# Patient Record
Sex: Female | Born: 1990 | Race: Black or African American | Hispanic: No | Marital: Single | Smoking: Current some day smoker
Health system: Southern US, Community
[De-identification: ages and names within clinical notes are randomized; demographics above are authoritative.]

## PROBLEM LIST (undated history)

## (undated) ENCOUNTER — Emergency Department (HOSPITAL_COMMUNITY): Admission: EM | Payer: BC Managed Care – PPO | Source: Home / Self Care

## (undated) DIAGNOSIS — J189 Pneumonia, unspecified organism: Secondary | ICD-10-CM

## (undated) DIAGNOSIS — G43909 Migraine, unspecified, not intractable, without status migrainosus: Secondary | ICD-10-CM

## (undated) DIAGNOSIS — F909 Attention-deficit hyperactivity disorder, unspecified type: Secondary | ICD-10-CM

## (undated) DIAGNOSIS — IMO0002 Reserved for concepts with insufficient information to code with codable children: Secondary | ICD-10-CM

## (undated) DIAGNOSIS — N946 Dysmenorrhea, unspecified: Secondary | ICD-10-CM

## (undated) HISTORY — DX: Reserved for concepts with insufficient information to code with codable children: IMO0002

## (undated) HISTORY — DX: Migraine, unspecified, not intractable, without status migrainosus: G43.909

## (undated) HISTORY — DX: Dysmenorrhea, unspecified: N94.6

---
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
## 2011-06-03 ENCOUNTER — Emergency Department (HOSPITAL_COMMUNITY)
Admission: EM | Admit: 2011-06-03 | Discharge: 2011-06-03 | Disposition: A | Discharge status: Home / Self Care | Payer: Self-pay | Attending: Emergency Medicine | Admitting: Emergency Medicine

## 2011-06-03 DIAGNOSIS — R1110 Vomiting, unspecified: Secondary | ICD-10-CM | POA: Insufficient documentation

## 2011-06-03 DIAGNOSIS — K5289 Other specified noninfective gastroenteritis and colitis: Secondary | ICD-10-CM | POA: Insufficient documentation

## 2011-06-03 DIAGNOSIS — R197 Diarrhea, unspecified: Secondary | ICD-10-CM | POA: Insufficient documentation

## 2011-06-03 DIAGNOSIS — O9989 Other specified diseases and conditions complicating pregnancy, childbirth and the puerperium: Secondary | ICD-10-CM | POA: Insufficient documentation

## 2011-06-03 DIAGNOSIS — R109 Unspecified abdominal pain: Secondary | ICD-10-CM | POA: Insufficient documentation

## 2011-06-03 LAB — CBC
HCT: 41 % (ref 36.0–46.0)
MCH: 30.3 pg (ref 26.0–34.0)
MCHC: 36.1 g/dL — ABNORMAL HIGH (ref 30.0–36.0)
MCV: 84 fL (ref 78.0–100.0)
Platelets: 257 10*3/uL (ref 150–400)
RDW: 12.2 % (ref 11.5–15.5)

## 2011-06-03 LAB — DIFFERENTIAL
Eosinophils Absolute: 0.1 10*3/uL (ref 0.0–0.7)
Eosinophils Relative: 1 % (ref 0–5)
Lymphocytes Relative: 26 % (ref 12–46)
Lymphs Abs: 3.2 10*3/uL (ref 0.7–4.0)
Monocytes Absolute: 1.5 10*3/uL — ABNORMAL HIGH (ref 0.1–1.0)

## 2011-06-03 LAB — URINALYSIS, ROUTINE W REFLEX MICROSCOPIC
Hgb urine dipstick: NEGATIVE
Ketones, ur: >80 mg/dL — AB
Specific Gravity, Urine: 1.029 (ref 1.005–1.030)
Urobilinogen, UA: 2 mg/dL — ABNORMAL HIGH (ref 0.0–1.0)

## 2011-06-03 LAB — BASIC METABOLIC PANEL
BUN: 7 mg/dL (ref 6–23)
Calcium: 9 mg/dL (ref 8.4–10.5)
Creatinine, Ser: 0.48 mg/dL — ABNORMAL LOW (ref 0.50–1.10)
GFR calc Af Amer: >60 mL/min (ref >60)
GFR calc non Af Amer: >60 mL/min (ref >60)

## 2011-11-13 DIAGNOSIS — R87619 Unspecified abnormal cytological findings in specimens from cervix uteri: Secondary | ICD-10-CM

## 2011-11-13 DIAGNOSIS — IMO0002 Reserved for concepts with insufficient information to code with codable children: Secondary | ICD-10-CM

## 2011-11-13 HISTORY — DX: Reserved for concepts with insufficient information to code with codable children: IMO0002

## 2011-11-13 HISTORY — DX: Unspecified abnormal cytological findings in specimens from cervix uteri: R87.619

## 2011-12-24 ENCOUNTER — Encounter (HOSPITAL_COMMUNITY): Payer: Self-pay | Admitting: *Deleted

## 2011-12-24 ENCOUNTER — Emergency Department (INDEPENDENT_AMBULATORY_CARE_PROVIDER_SITE_OTHER)
Admission: EM | Admit: 2011-12-24 | Discharge: 2011-12-24 | Disposition: A | Discharge status: Home / Self Care | Payer: PRIVATE HEALTH INSURANCE | Source: Home / Self Care | Attending: Emergency Medicine | Admitting: Emergency Medicine

## 2011-12-24 DIAGNOSIS — J40 Bronchitis, not specified as acute or chronic: Secondary | ICD-10-CM

## 2011-12-24 HISTORY — DX: Pneumonia, unspecified organism: J18.9

## 2011-12-24 MED ORDER — DOXYCYCLINE HYCLATE 100 MG PO TABS: 100.0000 mg | ORAL_TABLET | Freq: Two times a day (BID) | ORAL | Status: AC

## 2011-12-24 MED ORDER — ALBUTEROL SULFATE HFA 108 (90 BASE) MCG/ACT IN AERS: 1.0000 | INHALATION_SPRAY | Freq: Four times a day (QID) | RESPIRATORY_TRACT | Status: DC | PRN

## 2011-12-24 MED ORDER — GUAIFENESIN-CODEINE 100-10 MG/5ML PO SYRP: 10.0000 mL | ORAL_SOLUTION | Freq: Four times a day (QID) | ORAL | Status: AC | PRN

## 2011-12-24 NOTE — Discharge Instructions (Signed)

## 2011-12-24 NOTE — ED Provider Notes (Signed)
Chief Complaint  Patient presents with  . Pleurisy    History of Present Illness:   The patient presents with a two-week history of cough productive of yellow-green sputum, tightness in chest, weakness, fatigue, chills, chest pain, states it hurts to breathe, has had sore throat, nasal congestion, rhinorrhea, and sneezing.  Review of Systems:  Other than noted above, the patient denies any of the following symptoms. Systemic:  No fever, chills, sweats, fatigue, myalgias, headache, or anorexia. Eye:  No redness, pain or drainage. ENT:  No earache, nasal congestion, rhinorrhea, sinus pressure, or sore throat. Lungs:  No cough, sputum production, wheezing, shortness of breath. Or chest pain. GI:  No nausea, vomiting, abdominal pain or diarrhea. Skin:  No rash or itching.  PMFSH:  Past medical history, family history, social history, meds, and allergies were reviewed.  Physical Exam:   Vital signs:  BP 132/86  Pulse 78  Temp(Src) 99.6 F (37.6 C) (Oral)  Resp 18  SpO2 100%  LMP 12/13/2011 General:  Alert, in no distress. Eye:  No conjunctival injection or drainage. ENT:  TMs and canals were normal, without erythema or inflammation.  Nasal mucosa was clear and uncongested, without drainage.  Mucous membranes were moist.  Pharynx was clear, without exudate or drainage.  There were no oral ulcerations or lesions. Neck:  Supple, no adenopathy, tenderness or mass. Lungs:  No respiratory distress.  Lungs were clear to auscultation, without wheezes, rales or rhonchi.  Breath sounds were clear and equal bilaterally. Heart:  Regular rhythm, without gallops, murmers or rubs. Skin:  Clear, warm, and dry, without rash or lesions.  Assessment:   Diagnoses that have been ruled out:  None  Diagnoses that are still under consideration:  None  Final diagnoses:  Bronchitis      Plan:   1.  The following meds were prescribed:   New Prescriptions   ALBUTEROL (PROVENTIL HFA;VENTOLIN HFA) 108 (90  BASE) MCG/ACT INHALER    Inhale 1-2 puffs into the lungs every 6 (six) hours as needed for wheezing.   DOXYCYCLINE (VIBRA-TABS) 100 MG TABLET    Take 1 tablet (100 mg total) by mouth 2 (two) times daily.   GUAIFENESIN-CODEINE (GUIATUSS AC) 100-10 MG/5ML SYRUP    Take 10 mLs by mouth 4 (four) times daily as needed for cough.   2.  The patient was instructed in symptomatic care and handouts were given. 3.  The patient was told to return if becoming worse in any way, if no better in 3 or 4 days, and given some red flag symptoms that would indicate earlier return.   Roque Lias, MD 12/24/11 [phone]

## 2011-12-24 NOTE — ED Notes (Signed)
C/o chest burning. Chest congestion. Coughing causing pain

## 2012-07-29 ENCOUNTER — Emergency Department (HOSPITAL_COMMUNITY)
Admission: EM | Admit: 2012-07-29 | Discharge: 2012-07-29 | Disposition: A | Discharge status: Home / Self Care | Payer: Self-pay | Attending: Emergency Medicine | Admitting: Emergency Medicine

## 2012-07-29 ENCOUNTER — Emergency Department (HOSPITAL_COMMUNITY): Payer: Self-pay

## 2012-07-29 ENCOUNTER — Encounter (HOSPITAL_COMMUNITY): Payer: Self-pay | Admitting: Emergency Medicine

## 2012-07-29 DIAGNOSIS — J45909 Unspecified asthma, uncomplicated: Secondary | ICD-10-CM | POA: Insufficient documentation

## 2012-07-29 DIAGNOSIS — J9801 Acute bronchospasm: Secondary | ICD-10-CM

## 2012-07-29 DIAGNOSIS — J069 Acute upper respiratory infection, unspecified: Secondary | ICD-10-CM | POA: Insufficient documentation

## 2012-07-29 DIAGNOSIS — F17200 Nicotine dependence, unspecified, uncomplicated: Secondary | ICD-10-CM | POA: Insufficient documentation

## 2012-07-29 MED ORDER — PREDNISONE 20 MG PO TABS: ORAL_TABLET | ORAL | Status: DC

## 2012-07-29 MED ORDER — ALBUTEROL SULFATE HFA 108 (90 BASE) MCG/ACT IN AERS: 2.0000 | INHALATION_SPRAY | RESPIRATORY_TRACT | Status: DC | PRN

## 2012-07-29 NOTE — ED Provider Notes (Signed)
History   This chart was scribed for Bethany Horn, MD by Melba Coon. The patient was seen in room TR08C/TR08C and the patient's care was started at 5:01PM.    CSN: [id number]  Arrival date & time 07/29/12  1503   None     Chief Complaint  Patient presents with  . Cough    (Consider location/radiation/quality/duration/timing/severity/associated sxs/prior treatment) HPI Bethany Hampton is a 21 y.o. female who presents to the Emergency Department complaining of persistent, moderate to productive severe cough with an onset 3 days ago. Cough produces yellow and green sputum. Burning CP, cough-induced vomit x3, and SOB present since this morning. Wheezing and dizziness present. Denies confusion or hallucinations. Denies HA, fever, neck pain, sore throat, rash, back pain, abd pain, diarrhea, dysuria, or extremity pain, edema, weakness, numbness, or tingling. Wheezing and had to use an inhaler when she had previous Hx of pneumonia. No known allergies. No other pertinent medical symptoms.   Past Medical History  Diagnosis Date  . Asthma   . Pneumonia     Past Surgical History  Procedure Date  . Breast surgery     Family History  Problem Relation Age of Onset  . Heart failure Mother   . Cancer Mother   . Hypertension Sister     History  Substance Use Topics  . Smoking status: Current Some Day Smoker  . Smokeless tobacco: Not on file  . Alcohol Use: Yes     occasional    OB History    Grav Para Term Preterm Abortions TAB SAB Ect Mult Living   1 0   1           Review of Systems 10 Systems reviewed and all are negative for acute change except as noted in the HPI.   Allergies  Review of patient's allergies indicates no known allergies.  Home Medications   Current Outpatient Rx  Name Route Sig Dispense Refill  . ALBUTEROL SULFATE HFA 108 (90 BASE) MCG/ACT IN AERS Inhalation Inhale 1-2 puffs into the lungs every 6 (six) hours as needed for wheezing. 1 Inhaler 0    . ALBUTEROL SULFATE HFA 108 (90 BASE) MCG/ACT IN AERS Inhalation Inhale 2 puffs into the lungs every 2 (two) hours as needed for wheezing or shortness of breath (cough). 1 Inhaler 0  . PREDNISONE 20 MG PO TABS  3 tabs po day one, then 2 po daily x 4 days 11 tablet 0    BP 135/94  Pulse 95  Temp 97.5 F (36.4 C) (Oral)  Resp 18  SpO2 97%  LMP 07/20/2012  Physical Exam  Nursing note and vitals reviewed. Constitutional: She is oriented to person, place, and time. She appears well-developed and well-nourished. No distress.  HENT:  Head: Normocephalic and atraumatic.       oropharynx is mildly erythematous but no tonsilar swelling or exudate; uvula midline.  Eyes: EOM are normal.  Neck: Neck supple. No tracheal deviation present.  Cardiovascular: Normal rate, regular rhythm and normal heart sounds.   Pulmonary/Chest: Effort normal. No respiratory distress. She has wheezes (diffuse scattered bilateral wheezes and rhonchi). She has no rales. She exhibits tenderness (mild).       Can speak full sentences.  Abdominal: Soft. There is no tenderness.  Musculoskeletal: Normal range of motion.       generalized body aches especially around the bilateral upper extremities.  Neurological: She is alert and oriented to person, place, and time.  Skin: Skin is warm and dry.  Psychiatric: She has a normal mood and affect. Her behavior is normal.    ED Course  Procedures (including critical care time)   COORDINATION OF CARE:  5:04PM - imaging reviewed; imaging is unremarkable. Mrs , pinkus will be Rx with albuterol inhaler and prednisone and is advised to use Tylenol or Motrin at home. Pt is ready for d/c.   Labs Reviewed - No data to display Dg Chest 2 View  07/29/2012  *RADIOLOGY REPORT*  Clinical Data: Productive cough.  Chest heaviness.  CHEST - 2 VIEW  Comparison: None.  Findings: Trachea is midline.  Heart size normal.  Lungs are clear. No pleural fluid.  IMPRESSION: No acute findings.    Original Report Authenticated By: Reyes Ivan, M.D.      1. Bronchospasm   2. URI, acute       MDM  I personally performed the services described in this documentation, which was scribed in my presence. The recorded information has been reviewed and considered. Pt stable in ED with no significant deterioration in condition.Patient / Family / Caregiver informed of clinical course, understand medical decision-making process, and agree with plan.I doubt any other EMC precluding discharge at this time including, but not necessarily limited to the following:sepsis.        Bethany Horn, MD 07/30/12 [phone]

## 2012-07-29 NOTE — ED Notes (Signed)
EKG completed at 1512 and given to Dr. Rubin Payor. No OLD ekg.

## 2012-07-29 NOTE — ED Notes (Signed)
C/o productive cough with yellow and green sputum since Tuesday.  Reports burning sensation in chest, vomited x 3, and sob since this morning.

## 2013-03-21 ENCOUNTER — Encounter: Payer: Self-pay | Admitting: Obstetrics and Gynecology

## 2013-03-21 ENCOUNTER — Ambulatory Visit (INDEPENDENT_AMBULATORY_CARE_PROVIDER_SITE_OTHER): Payer: BC Managed Care – PPO | Admitting: Obstetrics and Gynecology

## 2013-03-21 VITALS — BP 122/80 | Ht 67.0 in | Wt 224.0 lb

## 2013-03-21 DIAGNOSIS — Z0000 Encounter for general adult medical examination without abnormal findings: Secondary | ICD-10-CM

## 2013-03-21 DIAGNOSIS — Z01419 Encounter for gynecological examination (general) (routine) without abnormal findings: Secondary | ICD-10-CM

## 2013-03-21 LAB — POCT URINALYSIS DIPSTICK
Bilirubin, UA: NEGATIVE
Ketones, UA: NEGATIVE
Leukocytes, UA: NEGATIVE
pH, UA: 5

## 2013-03-21 LAB — HEMOGLOBIN, FINGERSTICK: Hemoglobin, fingerstick: 15.8 g/dL (ref 12.0–16.0)

## 2013-03-21 NOTE — Patient Instructions (Signed)

## 2013-03-21 NOTE — Progress Notes (Signed)
22 y.o.   Single    African American   female   [id number]   here for annual exam.  Wants to restart birth control.  Nuva ring caused abd pain daily.  She tried pills but she always forgot them.  Scared she will gain weight with Depo-provera.  Sister has IUD and her cousin does too, so pt thinks she would like to try that.  Pt has severe menstrual cramps and heavy bleeding for first and fourth day of menses. Wants STD testing. 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          Sexually active: yes  The current method of family planning is condoms sometimes.    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   Last cholesterol check: never  Hgb:    15.8            Urine:neg   Family History  Problem Relation Age of Onset  . Heart failure Mother   . Hypertension Mother   . Hypertension Sister   . Cancer Paternal Grandmother     There are no active problems to display for this patient.   Past Medical History  Diagnosis Date  . Asthma   . Pneumonia   . Dysmenorrhea   . Abnormal pap 11/13/11    LSIL  HPV/MILD Dysplasia/CIN 1  . Migraines     Past Surgical History  Procedure Laterality Date  . Breast surgery  2005    Rt breast cyst(benign)    Allergies: Review of patient's allergies indicates no known allergies.  Current Outpatient Prescriptions  Medication Sig Dispense Refill  . albuterol (PROVENTIL HFA;VENTOLIN HFA) 108 (90 BASE) MCG/ACT inhaler Inhale 2 puffs into the lungs every 2 (two) hours as needed for wheezing or shortness of breath (cough).  1 Inhaler  0  . DiphenhydrAMINE HCl (BENADRYL ALLERGY PO) Take by mouth as needed.      . Ibuprofen (MIDOL PO) Take by mouth as needed.       No  current facility-administered medications for this visit.  Asthma is mild and she uses inhaler rarely.    ROS: Pertinent items are noted in HPI.  Social Hx:  Single, no children, works at a [employer] center,  In school at [employer] and will graduate in December with a degree in Sociology and minors in psych and special ed.  Wants to go to [employer] next fall for masters program in Social Work. No signif other.    Exam:    BP 122/80  Ht 5\' 7"  (1.702 m)  Wt 224 lb (101.606 kg)  BMI 35.08 kg/m2  LMP 03/02/2013   Wt Readings from Last 3 Encounters:  03/21/13 224 lb (101.606 kg)     Ht Readings from Last 3 Encounters:  03/21/13 5\' 7"  (1.702 m)    General appearance: alert, cooperative and appears stated age Head: Normocephalic, without obvious abnormality, atraumatic Neck: no adenopathy, supple, symmetrical, trachea midline and thyroid not enlarged, symmetric, no tenderness/mass/nodules Lungs: clear to auscultation bilaterally Breasts: Inspection negative, No nipple retraction or dimpling, No nipple discharge or bleeding, No axillary or supraclavicular adenopathy, Normal to palpation without dominant masses Heart: regular rate and rhythm Abdomen: soft, non-tender; bowel sounds  normal; no masses,  no organomegaly Extremities: extremities normal, atraumatic, no cyanosis or edema Skin: Skin color, texture, turgor normal. No rashes or lesions Lymph nodes: Cervical, supraclavicular, and axillary nodes normal. No abnormal inguinal nodes palpated Neurologic: Grossly normal   Pelvic: External genitalia:  no lesions              Urethra:  normal appearing urethra with no masses, tenderness or lesions              Bartholins and Skenes: normal                 Vagina: normal appearing vagina with normal color and discharge, no lesions              Cervix: normal appearance              Pap taken: yes        Bimanual Exam:  Uterus:  uterus is normal size, shape, consistency and nontender, mid,  mobile                                      Adnexa: normal adnexa in size, nontender and no masses                                      Rectovaginal: Confirms                                      Anus:  normal sphincter tone, no lesions  A: normal gyn exam, wants IUD and STD check     P: pap smear.  Discussed CIN with pt and questions answered.  May need colpo is pap abnl again.  counseled on breast self exam, STD prevention, family planning choices, diet and exercise return annually or prn   Brochure on Mirena given,  Pt very anxious about exam and may need pre procedure sedative   An After Visit Summary was printed and given to the patient.

## 2013-03-22 LAB — STD PANEL: Hepatitis B Surface Ag: NEGATIVE

## 2013-03-23 LAB — GC/CHLAMYDIA PROBE AMP, URINE: GC Probe Amp, Urine: NEGATIVE

## 2013-03-24 ENCOUNTER — Telehealth: Payer: Self-pay | Admitting: *Deleted

## 2013-03-24 LAB — IPS PAP TEST WITH HPV

## 2013-03-24 NOTE — Telephone Encounter (Signed)
Message copied by Ernest Haber on Fri Mar 24, 2013  5:00 PM ------      Message from: Meredeth Ide P      Created: Thu Mar 23, 2013  3:39 PM       Let pt know STD screen negative. ------

## 2013-03-28 ENCOUNTER — Telehealth: Payer: Self-pay | Admitting: *Deleted

## 2013-03-28 NOTE — Telephone Encounter (Signed)
PATIENT NOTIFIED OF LAB RESULTS OF STD TESTING.

## 2013-04-06 ENCOUNTER — Encounter (HOSPITAL_COMMUNITY): Payer: Self-pay | Admitting: Emergency Medicine

## 2013-04-06 ENCOUNTER — Emergency Department (HOSPITAL_COMMUNITY)
Admission: EM | Admit: 2013-04-06 | Discharge: 2013-04-06 | Disposition: A | Discharge status: Home / Self Care | Payer: BC Managed Care – PPO | Attending: Emergency Medicine | Admitting: Emergency Medicine

## 2013-04-06 DIAGNOSIS — Y929 Unspecified place or not applicable: Secondary | ICD-10-CM | POA: Insufficient documentation

## 2013-04-06 DIAGNOSIS — IMO0002 Reserved for concepts with insufficient information to code with codable children: Secondary | ICD-10-CM | POA: Insufficient documentation

## 2013-04-06 DIAGNOSIS — M545 Low back pain: Secondary | ICD-10-CM

## 2013-04-06 DIAGNOSIS — X500XXA Overexertion from strenuous movement or load, initial encounter: Secondary | ICD-10-CM | POA: Insufficient documentation

## 2013-04-06 DIAGNOSIS — Z87891 Personal history of nicotine dependence: Secondary | ICD-10-CM | POA: Insufficient documentation

## 2013-04-06 DIAGNOSIS — F17200 Nicotine dependence, unspecified, uncomplicated: Secondary | ICD-10-CM | POA: Insufficient documentation

## 2013-04-06 DIAGNOSIS — G8929 Other chronic pain: Secondary | ICD-10-CM | POA: Insufficient documentation

## 2013-04-06 DIAGNOSIS — G43909 Migraine, unspecified, not intractable, without status migrainosus: Secondary | ICD-10-CM | POA: Insufficient documentation

## 2013-04-06 DIAGNOSIS — R109 Unspecified abdominal pain: Secondary | ICD-10-CM | POA: Insufficient documentation

## 2013-04-06 DIAGNOSIS — J45909 Unspecified asthma, uncomplicated: Secondary | ICD-10-CM | POA: Insufficient documentation

## 2013-04-06 DIAGNOSIS — Z8701 Personal history of pneumonia (recurrent): Secondary | ICD-10-CM | POA: Insufficient documentation

## 2013-04-06 DIAGNOSIS — Z3202 Encounter for pregnancy test, result negative: Secondary | ICD-10-CM | POA: Insufficient documentation

## 2013-04-06 DIAGNOSIS — Y9389 Activity, other specified: Secondary | ICD-10-CM | POA: Insufficient documentation

## 2013-04-06 LAB — URINALYSIS, ROUTINE W REFLEX MICROSCOPIC
Leukocytes, UA: NEGATIVE
Protein, ur: NEGATIVE mg/dL
Urobilinogen, UA: 1 mg/dL (ref 0.0–1.0)

## 2013-04-06 LAB — URINALYSIS W MICROSCOPIC + REFLEX CULTURE
Bilirubin Urine: NEGATIVE
Hgb urine dipstick: NEGATIVE
Specific Gravity, Urine: 1.007 (ref 1.005–1.030)
pH: 5.5 (ref 5.0–8.0)

## 2013-04-06 LAB — URINE MICROSCOPIC-ADD ON

## 2013-04-06 MED ORDER — OXYCODONE-ACETAMINOPHEN 5-325 MG PO TABS: 2.0000 | ORAL_TABLET | ORAL | Status: DC | PRN

## 2013-04-06 NOTE — ED Notes (Signed)
C/o lower back pain since waking up Wednesday morning.  States she was moving furniture on Tuesday.

## 2013-04-06 NOTE — ED Notes (Signed)
MD at bedside. 

## 2013-04-06 NOTE — ED Provider Notes (Signed)
History     CSN: [id number]  Arrival date & time 04/06/13  0027   First MD Initiated Contact with Patient 04/06/13 0300      Chief Complaint  Patient presents with  . Back Pain    (Consider location/radiation/quality/duration/timing/severity/associated sxs/prior treatment) HPI This 22 year old female has chronic stable abdominal pain unchanged for at least a year and not the reason for her visit today, she also has chronic back pain for several months as well but after moving furniture within the last today she now is worse than usual back pain without radiation or associated weakness numbness or change in bowel or bladder function she is no fever no IV drug use no vaginal bleeding or vaginal discharge no dysuria no chest pain cough or shortness of breath and no treatment prior to arrival her pain is moderately severe worse with position and better if she stays still. Past Medical History  Diagnosis Date  . Asthma   . Pneumonia   . Dysmenorrhea   . Abnormal pap 11/13/11    LSIL  HPV/MILD Dysplasia/CIN 1  . Migraines     Past Surgical History  Procedure Laterality Date  . Breast surgery  2005    Rt breast cyst(benign)    Family History  Problem Relation Age of Onset  . Heart failure Mother   . Hypertension Mother   . Hypertension Sister   . Cancer Paternal Grandmother     History  Substance Use Topics  . Smoking status: Current Some Day Smoker    Types: Cigars  . Smokeless tobacco: Never Used  . Alcohol Use: No     Comment: l    OB History   Grav Para Term Preterm Abortions TAB SAB Ect Mult Living   1 0   1           Review of Systems 10 Systems reviewed and are negative for acute change except as noted in the HPI. Allergies  Review of patient's allergies indicates no known allergies.  Home Medications   Current Outpatient Rx  Name  Route  Sig  Dispense  Refill  . DiphenhydrAMINE HCl (BENADRYL ALLERGY PO)   Oral   Take 1-2 capsules by mouth every 6  (six) hours as needed (sensitive skin itching).          Marland Kitchen albuterol (PROVENTIL HFA;VENTOLIN HFA) 108 (90 BASE) MCG/ACT inhaler   Inhalation   Inhale 2 puffs into the lungs every 2 (two) hours as needed for wheezing or shortness of breath (cough).   1 Inhaler   0   . oxyCODONE-acetaminophen (PERCOCET) 5-325 MG per tablet   Oral   Take 2 tablets by mouth every 4 (four) hours as needed for pain.   15 tablet   0     BP 132/78  Pulse 78  Temp(Src) 98 F (36.7 C) (Oral)  Resp 16  SpO2 100%  LMP 04/02/2013  Physical Exam  Nursing note and vitals reviewed. Constitutional:  Awake, alert, nontoxic appearance with baseline speech.  HENT:  Head: Atraumatic.  Eyes: Pupils are equal, round, and reactive to light. Right eye exhibits no discharge. Left eye exhibits no discharge.  Neck: Neck supple.  Cardiovascular: Normal rate and regular rhythm.   No murmur heard. Pulmonary/Chest: Effort normal and breath sounds normal. No respiratory distress. She has no wheezes. She has no rales. She exhibits no tenderness.  Abdominal: Soft. Bowel sounds are normal. She exhibits no distension and no mass. There is tenderness. There is no rebound  and no guarding.  Minimal diffuse tenderness without rebound  Musculoskeletal: She exhibits tenderness. She exhibits no edema.       Thoracic back: She exhibits no tenderness.       Lumbar back: She exhibits no tenderness.  Back is diffusely tender more so in the lumbar and paralumbar region. Bilateral lower extremities non tender without new rashes or color change, baseline ROM with intact DP pulses, CR<2 secs all digits bilaterally, sensation baseline light touch bilaterally for pt, DTR's symmetric and intact bilaterally KJ / AJ, motor symmetric bilateral 5 / 5 hip flexion, quadriceps, hamstrings, EHL, foot dorsiflexion, foot plantarflexion, gait somewhat antalgic but without apparent new ataxia.  Neurological: She is alert.  Mental status baseline for  patient.  Upper extremity motor strength and sensation intact and symmetric bilaterally.  Skin: No rash noted.  Psychiatric: She has a normal mood and affect.    ED Course  Procedures (including critical care time) Mini-lab confirms HCG negative. 21 Mix-up with urine specimens; Pt's U/A unremarkable.  Labs Reviewed  URINALYSIS W MICROSCOPIC + REFLEX CULTURE - Abnormal; Notable for the following:    Leukocytes, UA SMALL (*)    Bacteria, UA MANY (*)    All other components within normal limits  URINALYSIS, ROUTINE W REFLEX MICROSCOPIC - Abnormal; Notable for the following:    Color, Urine AMBER (*)    APPearance CLOUDY (*)    Specific Gravity, Urine 1.035 (*)    Hgb urine dipstick LARGE (*)    All other components within normal limits  URINE MICROSCOPIC-ADD ON  POCT PREGNANCY, URINE   No results found.   1. Lumbar pain   2. Chronic low back pain   3. Chronic abdominal pain       MDM  Patient / Family / Caregiver informed of clinical course, understand medical decision-making process, and agree with plan.  I doubt any other EMC precluding discharge at this time including, but not necessarily limited to the following:SBI, cauda equina.         Hurman Horn, MD 04/06/13 [phone]

## 2013-04-22 ENCOUNTER — Emergency Department (HOSPITAL_COMMUNITY)
Admission: EM | Admit: 2013-04-22 | Discharge: 2013-04-22 | Disposition: A | Discharge status: Home / Self Care | Payer: BC Managed Care – PPO | Attending: Emergency Medicine | Admitting: Emergency Medicine

## 2013-04-22 ENCOUNTER — Encounter (HOSPITAL_COMMUNITY): Payer: Self-pay | Admitting: *Deleted

## 2013-04-22 DIAGNOSIS — F17200 Nicotine dependence, unspecified, uncomplicated: Secondary | ICD-10-CM | POA: Insufficient documentation

## 2013-04-22 DIAGNOSIS — N390 Urinary tract infection, site not specified: Secondary | ICD-10-CM

## 2013-04-22 DIAGNOSIS — D72829 Elevated white blood cell count, unspecified: Secondary | ICD-10-CM | POA: Insufficient documentation

## 2013-04-22 DIAGNOSIS — Z8742 Personal history of other diseases of the female genital tract: Secondary | ICD-10-CM | POA: Insufficient documentation

## 2013-04-22 DIAGNOSIS — R42 Dizziness and giddiness: Secondary | ICD-10-CM | POA: Insufficient documentation

## 2013-04-22 DIAGNOSIS — Z79899 Other long term (current) drug therapy: Secondary | ICD-10-CM | POA: Insufficient documentation

## 2013-04-22 DIAGNOSIS — Z8701 Personal history of pneumonia (recurrent): Secondary | ICD-10-CM | POA: Insufficient documentation

## 2013-04-22 DIAGNOSIS — Z8679 Personal history of other diseases of the circulatory system: Secondary | ICD-10-CM | POA: Insufficient documentation

## 2013-04-22 DIAGNOSIS — R112 Nausea with vomiting, unspecified: Secondary | ICD-10-CM | POA: Insufficient documentation

## 2013-04-22 DIAGNOSIS — J45909 Unspecified asthma, uncomplicated: Secondary | ICD-10-CM | POA: Insufficient documentation

## 2013-04-22 DIAGNOSIS — Z3202 Encounter for pregnancy test, result negative: Secondary | ICD-10-CM | POA: Insufficient documentation

## 2013-04-22 LAB — COMPREHENSIVE METABOLIC PANEL
ALT: 11 U/L (ref 0–35)
BUN: 11 mg/dL (ref 6–23)
CO2: 25 mEq/L (ref 19–32)
Calcium: 9.3 mg/dL (ref 8.4–10.5)
Creatinine, Ser: 0.75 mg/dL (ref 0.50–1.10)
GFR calc Af Amer: >90 mL/min (ref >90)
GFR calc non Af Amer: >90 mL/min (ref >90)
Glucose, Bld: 100 mg/dL — ABNORMAL HIGH (ref 70–99)
Sodium: 136 mEq/L (ref 135–145)

## 2013-04-22 LAB — CBC WITH DIFFERENTIAL/PLATELET
Eosinophils Relative: 3 % (ref 0–5)
HCT: 44 % (ref 36.0–46.0)
Hemoglobin: 15.6 g/dL — ABNORMAL HIGH (ref 12.0–15.0)
Lymphocytes Relative: 28 % (ref 12–46)
Lymphs Abs: 3.2 10*3/uL (ref 0.7–4.0)
MCV: 84.9 fL (ref 78.0–100.0)
Monocytes Absolute: 1.3 10*3/uL — ABNORMAL HIGH (ref 0.1–1.0)
Monocytes Relative: 12 % (ref 3–12)
RBC: 5.18 MIL/uL — ABNORMAL HIGH (ref 3.87–5.11)
WBC: 11.3 10*3/uL — ABNORMAL HIGH (ref 4.0–10.5)

## 2013-04-22 LAB — URINE MICROSCOPIC-ADD ON

## 2013-04-22 LAB — URINALYSIS, ROUTINE W REFLEX MICROSCOPIC
Nitrite: NEGATIVE
Specific Gravity, Urine: 1.028 (ref 1.005–1.030)
Urobilinogen, UA: 1 mg/dL (ref 0.0–1.0)

## 2013-04-22 MED ORDER — ONDANSETRON 4 MG PO TBDP
ORAL_TABLET | ORAL | Status: AC
  Filled 2013-04-22: qty 2

## 2013-04-22 MED ORDER — ONDANSETRON 4 MG PO TBDP
8.0000 mg | ORAL_TABLET | Freq: Once | ORAL | Status: AC
  Administered 2013-04-22: 8 mg via ORAL

## 2013-04-22 MED ORDER — ONDANSETRON 4 MG PO TBDP
4.0000 mg | ORAL_TABLET | Freq: Once | ORAL | Status: AC
  Administered 2013-04-22: 4 mg via ORAL
  Filled 2013-04-22: qty 1

## 2013-04-22 MED ORDER — CIPROFLOXACIN HCL 500 MG PO TABS: 500.0000 mg | ORAL_TABLET | Freq: Two times a day (BID) | ORAL | Status: DC

## 2013-04-22 MED ORDER — ONDANSETRON 4 MG PO TBDP: 4.0000 mg | ORAL_TABLET | Freq: Three times a day (TID) | ORAL | Status: DC | PRN

## 2013-04-22 NOTE — ED Notes (Signed)
Reports feeling bad since getting up this am, having n/v, fatigue and near syncope while at work. No acute distress noted at triage.

## 2013-04-22 NOTE — ED Notes (Signed)
Pt reports nausea, vomiting, diarrhea since yesterday. Denies dysuria, vaginal discharge , or abdominal pain. Reports being outside in the heat and getting dizzy and light headed. Thinks she may be dehydrated.

## 2013-04-22 NOTE — ED Provider Notes (Signed)
History  This chart was scribed for non-physician practitioner working with Bethany Chick, MD by Bethany Hampton, ED scribe. This patient was seen in room TR09C/TR09C and the patient's care was started at 8:23 PM.  CSN: [id number]  Arrival date & time 04/22/13  1803   Chief Complaint  Patient presents with  . Emesis    The history is provided by the patient. No language interpreter was used.    HPI Comments: Bethany Hampton is a 22 y.o. female who presents to the Emergency Department complaining of intermittent nausea and vomiting since this morning. Pt states she got very lightheaded at work today, followed by immediate nausea and vomiting. Pt notes she has been out in the hot sun the past few days at family events and has not been drinking fluids like she should.  Pt states last time she got dehydrated she had similar sx.  Denies any fevers, sweats, chills.  No sick contacts.  No current headache, photophobia, dizziness, weakness, confusion, AMS.  No urinary sx or vaginal discharge.  Denies possibility of pregnancy at this time.   Past Medical History  Diagnosis Date  . Asthma   . Pneumonia   . Dysmenorrhea   . Abnormal pap 11/13/11    LSIL  HPV/MILD Dysplasia/CIN 1  . Migraines     Past Surgical History  Procedure Laterality Date  . Breast surgery  2005    Rt breast cyst(benign)    Family History  Problem Relation Age of Onset  . Heart failure Mother   . Hypertension Mother   . Hypertension Sister   . Cancer Paternal Grandmother     History  Substance Use Topics  . Smoking status: Current Some Day Smoker    Types: Cigars  . Smokeless tobacco: Never Used  . Alcohol Use: No     Comment: l    OB History   Grav Para Term Preterm Abortions TAB SAB Ect Mult Living   1 0   1           Review of Systems  Gastrointestinal: Positive for nausea and vomiting.  Neurological: Positive for light-headedness.  All other systems reviewed and are negative.    Allergies   Review of patient's allergies indicates no known allergies.  Home Medications   Current Outpatient Rx  Name  Route  Sig  Dispense  Refill  . albuterol (PROVENTIL HFA;VENTOLIN HFA) 108 (90 BASE) MCG/ACT inhaler   Inhalation   Inhale 2 puffs into the lungs every 2 (two) hours as needed for wheezing or shortness of breath (cough).   1 Inhaler   0   . DiphenhydrAMINE HCl (BENADRYL ALLERGY PO)   Oral   Take 1-2 capsules by mouth every 6 (six) hours as needed (sensitive skin itching).          Marland Kitchen oxyCODONE-acetaminophen (PERCOCET) 5-325 MG per tablet   Oral   Take 2 tablets by mouth every 4 (four) hours as needed for pain.   15 tablet   0     BP 135/82  Pulse 88  Temp(Src) 98.3 F (36.8 C) (Oral)  Resp 18  Ht 5\' 7"  (1.702 m)  Wt 220 lb (99.791 kg)  BMI 34.45 kg/m2  SpO2 98%  LMP 04/02/2013  Physical Exam  Nursing note and vitals reviewed. Constitutional: She is oriented to person, place, and time. She appears well-developed and well-nourished.  HENT:  Head: Normocephalic and atraumatic.  Mouth/Throat: Uvula is midline, oropharynx is clear and moist and mucous membranes  are normal. No oropharyngeal exudate.  Eyes: Conjunctivae and EOM are normal.  Neck: Normal range of motion. Neck supple.  Cardiovascular: Normal rate, regular rhythm and normal heart sounds.   Pulmonary/Chest: Effort normal and breath sounds normal. No respiratory distress.  Abdominal: Soft. Bowel sounds are normal. She exhibits no distension. There is no rebound, no CVA tenderness, no tenderness at McBurney's point and negative Murphy's sign.  Musculoskeletal: Normal range of motion.  Neurological: She is alert and oriented to person, place, and time. She has normal strength. No cranial nerve deficit or sensory deficit.  CN grossly intact, moves all extremities appropriately, no acute neuro deficits appreciated  Skin: Skin is warm and dry.  Psychiatric: She has a normal mood and affect.    ED Course   Procedures (including critical care time)  DIAGNOSTIC STUDIES: Oxygen Saturation is 98% on RA, normal by my interpretation.    COORDINATION OF CARE: 8:38 PM-Discussed treatment plan with pt at bedside and pt agreed to plan.   Labs Reviewed  CBC WITH DIFFERENTIAL - Abnormal; Notable for the following:    WBC 11.3 (*)    RBC 5.18 (*)    Hemoglobin 15.6 (*)    Monocytes Absolute 1.3 (*)    All other components within normal limits  COMPREHENSIVE METABOLIC PANEL - Abnormal; Notable for the following:    Glucose, Bld 100 (*)    All other components within normal limits  URINALYSIS, ROUTINE W REFLEX MICROSCOPIC - Abnormal; Notable for the following:    Color, Urine AMBER (*)    APPearance TURBID (*)    Bilirubin Urine SMALL (*)    Ketones, ur 15 (*)    Leukocytes, UA SMALL (*)    All other components within normal limits  URINE MICROSCOPIC-ADD ON - Abnormal; Notable for the following:    Squamous Epithelial / LPF MANY (*)    Bacteria, UA MANY (*)    All other components within normal limits  URINE CULTURE  POCT PREGNANCY, URINE   No results found.   1. Nausea & vomiting   2. UTI (lower urinary tract infection)       MDM   Labs as above, mild leukocytosis at 11.3. u-preg negative. UA with infection, culture pending.   Sx improved with PO zofran, patient observed for over an hour without recurrent vomiting. Tolerating by mouth fluids without difficultly.  Pt afebrile, non-toxic appearing, NAD, VS stable- ok for discharge. Rx Zofran and Cipro. Discussed plan with patient, she agreed. Return precautions advised.  I personally performed the services described in this documentation, which was scribed in my presence. The recorded information has been reviewed and is accurate.   Garlon Hatchet, PA-C 04/22/13 2230

## 2013-04-22 NOTE — ED Provider Notes (Signed)
Medical screening examination/treatment/procedure(s) were performed by non-physician practitioner and as supervising physician I was immediately available for consultation/collaboration.  [name], MD 04/22/13 2238

## 2013-04-22 NOTE — ED Notes (Signed)
Pt passed PO challenge. States she is ready for discharge

## 2013-04-24 LAB — URINE CULTURE: Colony Count: >=100000

## 2013-05-08 ENCOUNTER — Telehealth: Payer: Self-pay | Admitting: Obstetrics and Gynecology

## 2013-05-08 NOTE — Telephone Encounter (Signed)
Patient notified of normal STD testing and Pap Smear of normal findings.for  03/21/2013.

## 2013-05-08 NOTE — Telephone Encounter (Signed)
Pt calling for results

## 2013-05-11 ENCOUNTER — Telehealth (HOSPITAL_COMMUNITY): Payer: Self-pay | Admitting: *Deleted

## 2013-05-22 ENCOUNTER — Encounter: Payer: Self-pay | Admitting: Obstetrics and Gynecology

## 2013-05-22 ENCOUNTER — Ambulatory Visit (INDEPENDENT_AMBULATORY_CARE_PROVIDER_SITE_OTHER): Payer: BC Managed Care – PPO | Admitting: Obstetrics and Gynecology

## 2013-05-22 VITALS — BP 118/78 | HR 86 | Resp 18 | Ht 67.0 in | Wt 208.0 lb

## 2013-05-22 DIAGNOSIS — N390 Urinary tract infection, site not specified: Secondary | ICD-10-CM

## 2013-05-22 DIAGNOSIS — R109 Unspecified abdominal pain: Secondary | ICD-10-CM

## 2013-05-22 DIAGNOSIS — R5383 Other fatigue: Secondary | ICD-10-CM

## 2013-05-22 DIAGNOSIS — R5381 Other malaise: Secondary | ICD-10-CM

## 2013-05-22 LAB — CBC
HCT: 49.3 % — ABNORMAL HIGH (ref 36.0–46.0)
Hemoglobin: 16.8 g/dL — ABNORMAL HIGH (ref 12.0–15.0)
MCH: 29.8 pg (ref 26.0–34.0)
MCHC: 34.1 g/dL (ref 30.0–36.0)
MCV: 87.6 fL (ref 78.0–100.0)

## 2013-05-22 LAB — POCT URINALYSIS DIPSTICK
Blood, UA: 250
pH, UA: 5

## 2013-05-22 NOTE — Progress Notes (Signed)
22 yo SBF with one month h/o nausea and vomiting, early satiety.  Went to the ER and was told she had a UTI and given rx for cipro, but she didn't trust their evaluation because they mixed her urine sample up with the lady next door in the ER. So she never took the Cipro, but she still does have the rx.  She stayed out of work for 2 weeks, but now has gone back to work, but still now is having nausea - she only throws up though once she gets home and tries to eat.  She is not having dysuria.  No fever.  In the ER on June 21st, her white count was in the 11,000 range.  Her urine culture was negative.  She has body aches and lack of energy.  She does have some times when she feels good.  Today, her urine here does show positive nitrites and small leuks.  Pt works and goes to school and feels absolutely exhausted.    Exam:  BF NAD  Temp 98.1 Heart:  RRR Lungs:  Clear ABD:  Soft, NT, no masses, nl BS Pelvic:  Ext nl, vag and cervix appear healthy, no discharge, no purulence  BM:  Uterus ant, mobile, NT, adnexa w/o masses.  BUS neg.    A:  UTI today;  Unsure etiology of nausea since June 21st.  Fatigue, recurrent nausea.    P:  Pt instructed to proceed with the rx she has for Cipro.  Check CBC to look at wbc's.  Then return if she doesn't feel better.  It's had to tell how much of her symptomatology is from the UTI, and what could be from exhaustion, stress, maybe some GI issue??, maybe trouble at work??  Will need to re-evaluate if she doesn't feel better after the cipro.  Note given to be out of work this afternoon.  Pt asked if I could give her an excuse for the two weeks she stayed out at the end of June, but I declined.

## 2013-05-22 NOTE — Patient Instructions (Addendum)
Take the Cipro twice a day for a week.  We will call you with your blood test result.  If you don't feel better after a week on the cipro, come back!

## 2013-05-23 ENCOUNTER — Other Ambulatory Visit: Payer: Self-pay | Admitting: Obstetrics and Gynecology

## 2013-05-23 DIAGNOSIS — R5381 Other malaise: Secondary | ICD-10-CM

## 2013-05-24 NOTE — Addendum Note (Signed)
Addended by: Clide Dales R on: 05/24/2013 10:34 AM   Modules accepted: Orders

## 2013-05-25 ENCOUNTER — Telehealth: Payer: Self-pay | Admitting: Obstetrics and Gynecology

## 2013-05-25 LAB — TRANSFERRIN: Transferrin: 247 mg/dL (ref 200–360)

## 2013-05-25 NOTE — Telephone Encounter (Signed)
Patient calling for results from bloodwork

## 2013-05-26 NOTE — Telephone Encounter (Signed)
Pt. Notified of lab results cm

## 2013-06-04 IMAGING — CR DG CHEST 2V
2 series · 2 of 2 positions shown · non-contrast
Comparison: None.

CLINICAL DATA: Productive cough.  Chest heaviness.

CHEST - 2 VIEW

[w chest pa]
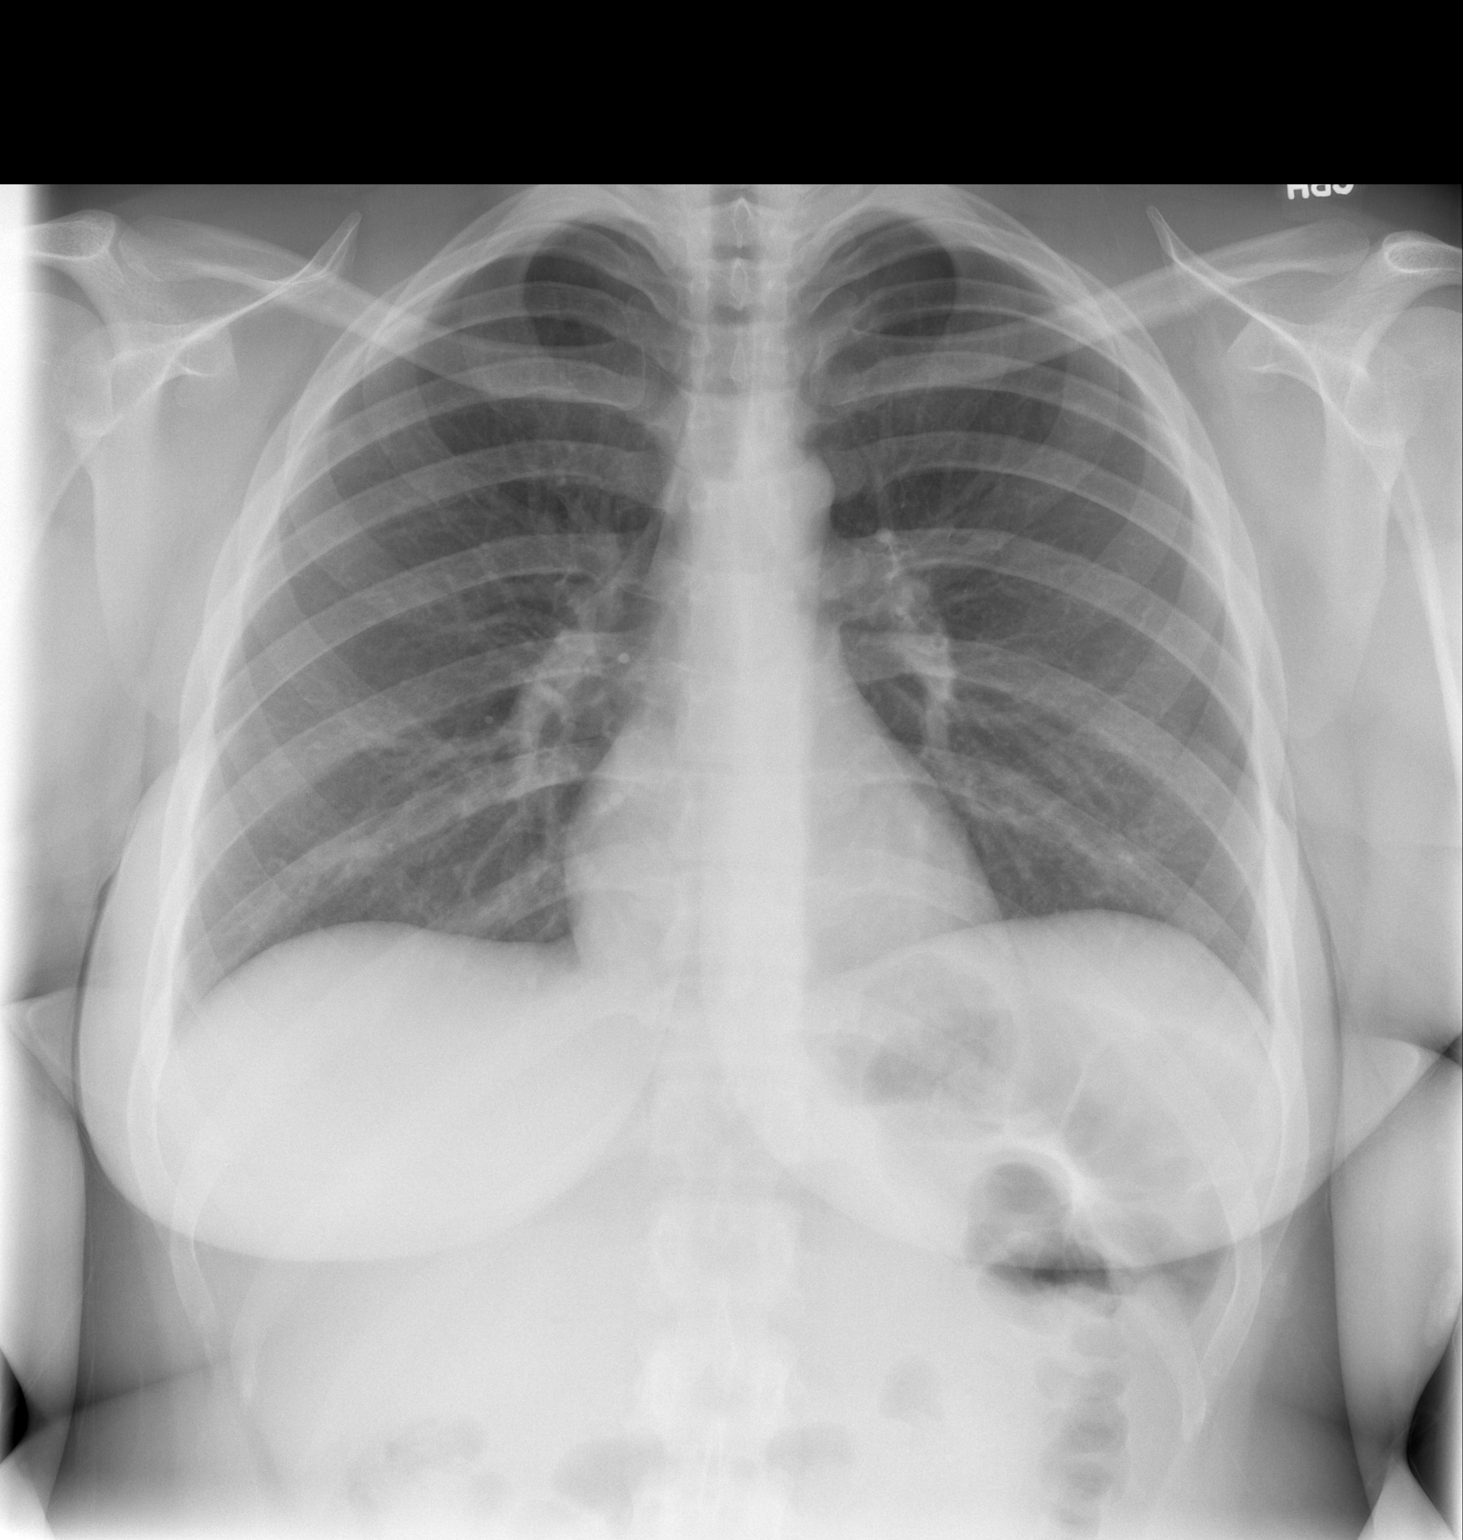

[w chest lat]
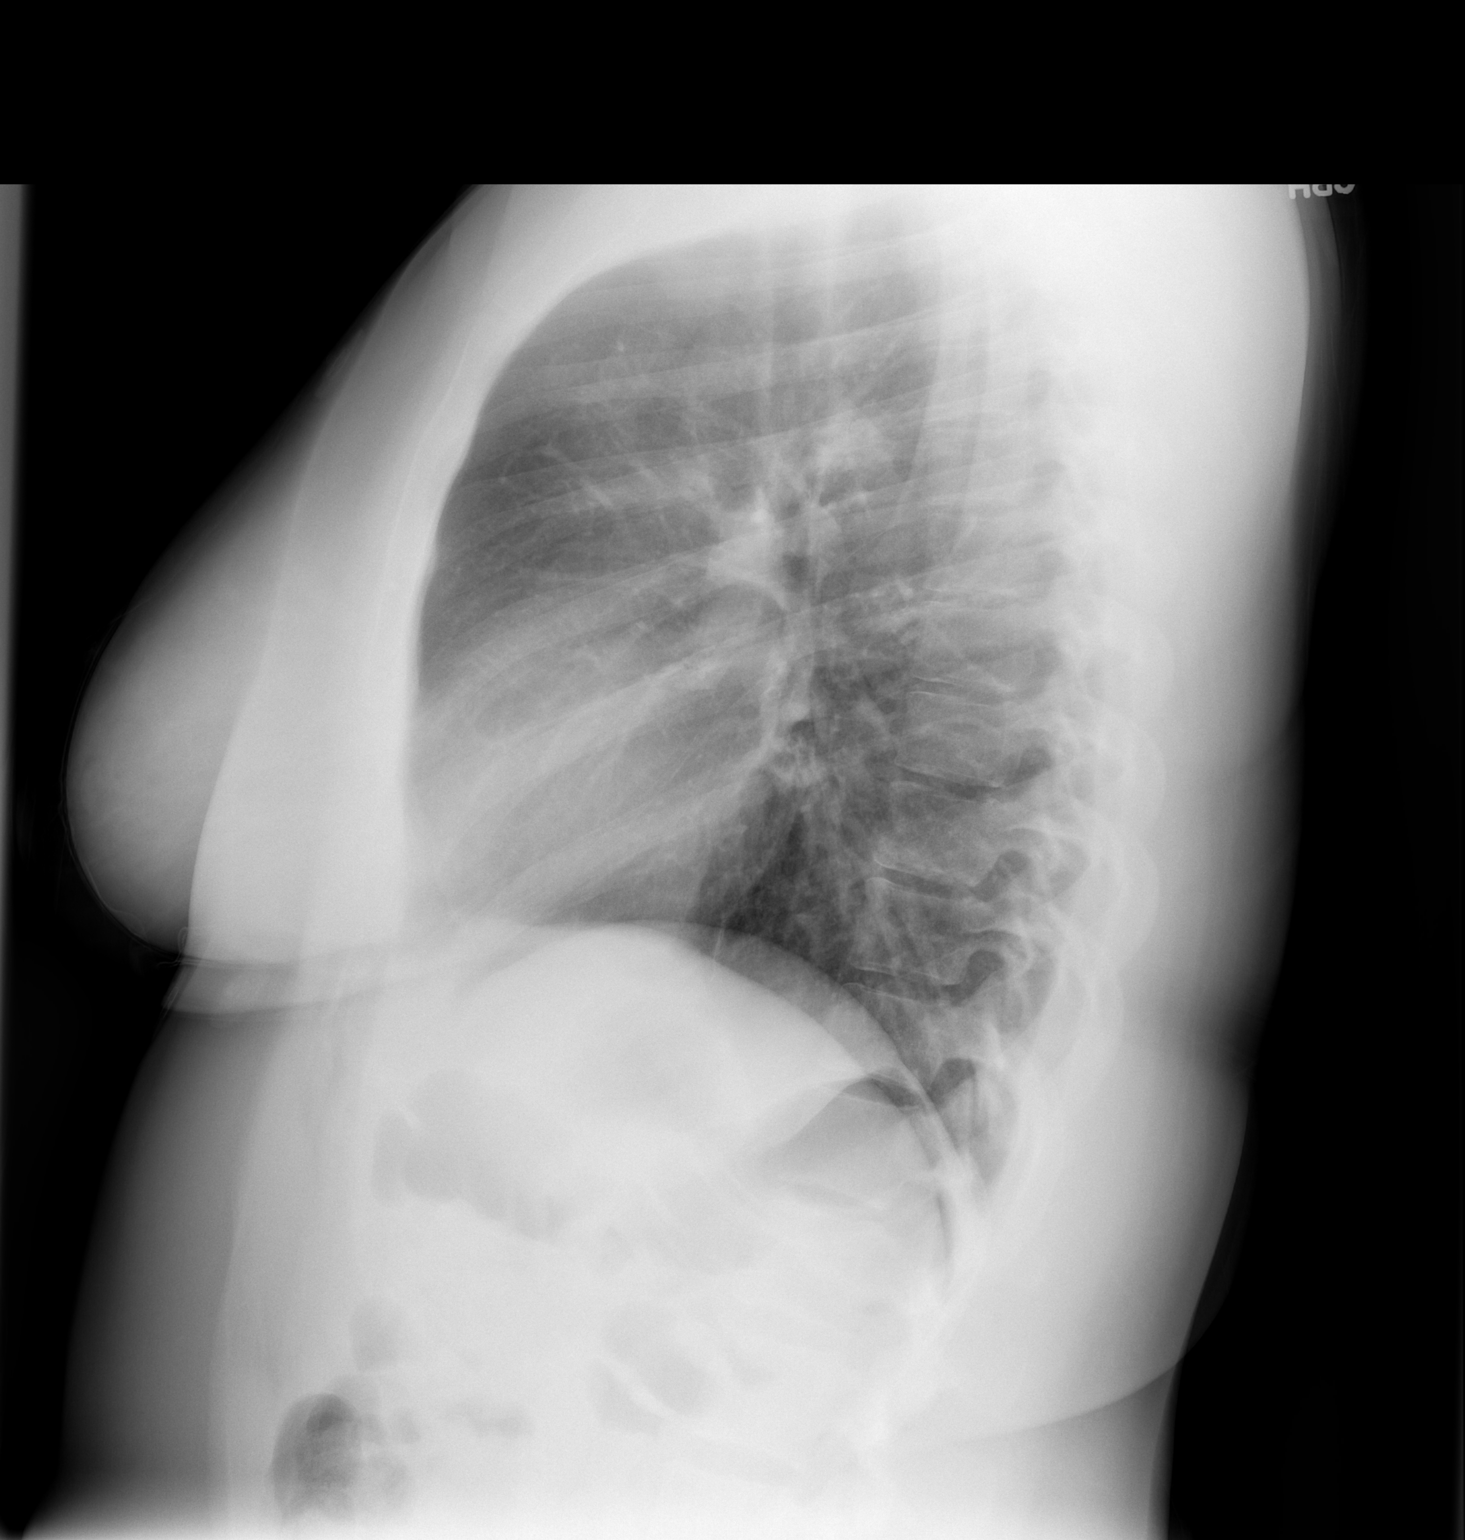

[2 of 2 positions shown; findings below may reference images not displayed]

FINDINGS: Trachea is midline.  Heart size normal.  Lungs are clear.
No pleural fluid.
IMPRESSION: No acute findings.

## 2013-09-07 ENCOUNTER — Other Ambulatory Visit: Payer: Self-pay

## 2013-12-07 ENCOUNTER — Encounter: Payer: Self-pay | Admitting: Obstetrics & Gynecology

## 2014-01-11 ENCOUNTER — Encounter: Payer: Self-pay | Admitting: Obstetrics & Gynecology

## 2014-09-03 ENCOUNTER — Encounter: Payer: Self-pay | Admitting: Obstetrics and Gynecology

## 2015-01-03 DIAGNOSIS — K819 Cholecystitis, unspecified: Secondary | ICD-10-CM | POA: Insufficient documentation

## 2016-02-22 ENCOUNTER — Encounter: Payer: Self-pay | Admitting: Emergency Medicine

## 2016-02-22 ENCOUNTER — Emergency Department
Admission: EM | Admit: 2016-02-22 | Discharge: 2016-02-22 | Disposition: A | Discharge status: Home / Self Care | Payer: Medicaid Other | Attending: Emergency Medicine | Admitting: Emergency Medicine

## 2016-02-22 DIAGNOSIS — J45909 Unspecified asthma, uncomplicated: Secondary | ICD-10-CM | POA: Diagnosis not present

## 2016-02-22 DIAGNOSIS — F909 Attention-deficit hyperactivity disorder, unspecified type: Secondary | ICD-10-CM | POA: Diagnosis not present

## 2016-02-22 DIAGNOSIS — Z79899 Other long term (current) drug therapy: Secondary | ICD-10-CM | POA: Insufficient documentation

## 2016-02-22 DIAGNOSIS — E860 Dehydration: Secondary | ICD-10-CM | POA: Insufficient documentation

## 2016-02-22 DIAGNOSIS — O26891 Other specified pregnancy related conditions, first trimester: Secondary | ICD-10-CM | POA: Diagnosis not present

## 2016-02-22 DIAGNOSIS — O219 Vomiting of pregnancy, unspecified: Secondary | ICD-10-CM | POA: Diagnosis present

## 2016-02-22 DIAGNOSIS — F17210 Nicotine dependence, cigarettes, uncomplicated: Secondary | ICD-10-CM | POA: Insufficient documentation

## 2016-02-22 DIAGNOSIS — O2341 Unspecified infection of urinary tract in pregnancy, first trimester: Secondary | ICD-10-CM

## 2016-02-22 DIAGNOSIS — N390 Urinary tract infection, site not specified: Secondary | ICD-10-CM | POA: Insufficient documentation

## 2016-02-22 HISTORY — DX: Attention-deficit hyperactivity disorder, unspecified type: F90.9

## 2016-02-22 LAB — CBC
HEMATOCRIT: 41.4 % (ref 35.0–47.0)
HEMOGLOBIN: 14.1 g/dL (ref 12.0–16.0)
MCH: 29.7 pg (ref 26.0–34.0)
MCHC: 34.2 g/dL (ref 32.0–36.0)
MCV: 86.9 fL (ref 80.0–100.0)
Platelets: 200 10*3/uL (ref 150–440)
RBC: 4.76 MIL/uL (ref 3.80–5.20)
RDW: 13.6 % (ref 11.5–14.5)
WBC: 11.2 10*3/uL — ABNORMAL HIGH (ref 3.6–11.0)

## 2016-02-22 LAB — BASIC METABOLIC PANEL
ANION GAP: 7 (ref 5–15)
BUN: 8 mg/dL (ref 6–20)
CALCIUM: 9 mg/dL (ref 8.9–10.3)
CHLORIDE: 105 mmol/L (ref 101–111)
CO2: 22 mmol/L (ref 22–32)
Creatinine, Ser: 0.58 mg/dL (ref 0.44–1.00)
GFR calc Af Amer: >60 mL/min (ref >60)
GFR calc non Af Amer: >60 mL/min (ref >60)
GLUCOSE: 128 mg/dL — AB (ref 65–99)
POTASSIUM: 3.2 mmol/L — AB (ref 3.5–5.1)
Sodium: 134 mmol/L — ABNORMAL LOW (ref 135–145)

## 2016-02-22 LAB — HCG, QUANTITATIVE, PREGNANCY: hCG, Beta Chain, Quant, S: 101555 m[IU]/mL — ABNORMAL HIGH (ref <5)

## 2016-02-22 LAB — URINALYSIS COMPLETE WITH MICROSCOPIC (ARMC ONLY)
Bilirubin Urine: NEGATIVE
Glucose, UA: NEGATIVE mg/dL
HGB URINE DIPSTICK: NEGATIVE
Nitrite: NEGATIVE
PH: 5 (ref 5.0–8.0)
PROTEIN: 30 mg/dL — AB
Specific Gravity, Urine: 1.023 (ref 1.005–1.030)

## 2016-02-22 LAB — ABO/RH: ABO/RH(D): B POS

## 2016-02-22 MED ORDER — FOSFOMYCIN TROMETHAMINE 3 G PO PACK
3.0000 g | PACK | Freq: Once | ORAL | Status: AC
Start: 2016-02-22 — End: 2016-02-22
  Administered 2016-02-22: 3 g via ORAL
  Filled 2016-02-22: qty 3

## 2016-02-22 MED ORDER — FOSFOMYCIN TROMETHAMINE 3 G PO PACK: 3.0000 g | PACK | Freq: Once | ORAL | Status: DC

## 2016-02-22 MED ORDER — PRENATAL VITAMINS 0.8 MG PO TABS: 1.0000 | ORAL_TABLET | Freq: Every day | ORAL | Status: DC

## 2016-02-22 MED ORDER — ONDANSETRON 4 MG PO TBDP: 4.0000 mg | ORAL_TABLET | Freq: Three times a day (TID) | ORAL | Status: DC | PRN

## 2016-02-22 MED ORDER — ONDANSETRON HCL 4 MG/2ML IJ SOLN
4.0000 mg | Freq: Once | INTRAMUSCULAR | Status: AC
  Administered 2016-02-22: 4 mg via INTRAVENOUS
  Filled 2016-02-22: qty 2

## 2016-02-22 MED ORDER — DEXTROSE 5 % AND 0.9 % NACL IV BOLUS
1000.0000 mL | Freq: Once | INTRAVENOUS | Status: AC
  Administered 2016-02-22: 1000 mL via INTRAVENOUS
  Filled 2016-02-22: qty 1000

## 2016-02-22 NOTE — ED Provider Notes (Signed)
[facility] Emergency Department Provider Note  ____________________________________________  Time seen: 3:20 PM  I have reviewed the triage vital signs and the nursing notes.   HISTORY  Chief Complaint Emesis During Pregnancy and Dizziness    HPI Bethany Hampton is a 25 y.o. female who complains of lightheadedness with standing and occasional nausea and vomiting and decreased oral intake over the past week. She [redacted] weeks pregnant. She is tying to follow-up with obstetrics in [place] in one week. She has not started taking prenatal vitamins yet. No vaginal bleeding or leakage of fluid. No dysuria or urgency but does have some frequency. No back pain fever chills.     Past Medical History  Diagnosis Date  . Asthma   . Pneumonia   . Dysmenorrhea   . Abnormal pap 11/13/11    LSIL  HPV/MILD Dysplasia/CIN 1  . Migraines   . ADHD (attention deficit hyperactivity disorder)      There are no active problems to display for this patient.    Past Surgical History  Procedure Laterality Date  . Breast surgery  2005    Rt breast cyst(benign)  Cholecystectomy   Current Outpatient Rx  Name  Route  Sig  Dispense  Refill  . albuterol (PROVENTIL HFA;VENTOLIN HFA) 108 (90 BASE) MCG/ACT inhaler   Inhalation   Inhale 2 puffs into the lungs every 2 (two) hours as needed for wheezing or shortness of breath (cough).   1 Inhaler   0   . ciprofloxacin (CIPRO) 500 MG tablet   Oral   Take 1 tablet (500 mg total) by mouth 2 (two) times daily.   14 tablet   0   . ondansetron (ZOFRAN ODT) 4 MG disintegrating tablet   Oral   Take 1 tablet (4 mg total) by mouth every 8 (eight) hours as needed for nausea.   10 tablet   0   . ondansetron (ZOFRAN ODT) 4 MG disintegrating tablet   Oral   Take 1 tablet (4 mg total) by mouth every 8 (eight) hours as needed for nausea or vomiting.   20 tablet   0   . Prenatal Multivit-Min-Fe-FA (PRENATAL VITAMINS) 0.8 MG tablet   Oral   Take 1 tablet by mouth daily.   90 tablet   3      Allergies Review of patient's allergies indicates no known allergies.   Family History  Problem Relation Age of Onset  . Heart failure Mother   . Hypertension Mother   . Hypertension Sister   . Cancer Paternal Grandmother     Social History Social History  Substance Use Topics  . Smoking status: Current Some Day Smoker    Types: Cigars  . Smokeless tobacco: Never Used  . Alcohol Use: No     Comment: l    Review of Systems  Constitutional:   No fever or chills.  Eyes:   No vision changes.  ENT:   No sore throat. No rhinorrhea. Cardiovascular:   No chest pain. Respiratory:   No dyspnea or cough. Gastrointestinal:   Negative for abdominal pain, positive vomiting no diarrhea.  No bloody stool. Genitourinary:   Negative for dysuria or difficulty urinating. Positive frequency Musculoskeletal:   Negative for focal pain or swelling Neurological:   Negative for headaches 10-point ROS otherwise negative.  ____________________________________________   PHYSICAL EXAM:  VITAL SIGNS: ED Triage Vitals  Enc Vitals Group     BP 02/22/16 1411 121/65 mmHg     Pulse Rate 02/22/16  1411 90     Resp 02/22/16 1411 20     Temp 02/22/16 1411 97.5 F (36.4 C)     Temp Source 02/22/16 1411 Oral     SpO2 02/22/16 1411 99 %     Weight 02/22/16 1411 211 lb (95.709 kg)     Height 02/22/16 1411  (1.702 m)     Head Cir --      Peak Flow --      Pain Score 02/22/16 1412 0     Pain Loc --      Pain Edu? --      Excl. in GC? --     Vital signs reviewed, nursing assessments reviewed.   Constitutional:   Alert and oriented. Well appearing and in no distress. Eyes:   No scleral icterus. No conjunctival pallor. PERRL. EOMI ENT   Head:   Normocephalic and atraumatic.   Nose:   No congestion/rhinnorhea. No septal hematoma   Mouth/Throat:   MMM, no pharyngeal erythema. No peritonsillar mass.    Neck:   No  stridor. No SubQ emphysema. No meningismus. Hematological/Lymphatic/Immunilogical:   No cervical lymphadenopathy. Cardiovascular:   RRR. Symmetric bilateral radial and DP pulses.  No murmurs.  Respiratory:   Normal respiratory effort without tachypnea nor retractions. Breath sounds are clear and equal bilaterally. No wheezes/rales/rhonchi. Gastrointestinal:   Soft With mild suprapubic tenderness. Non distended. There is no CVA tenderness.  No rebound, rigidity, or guarding. Genitourinary:   deferred Musculoskeletal:   Nontender with normal range of motion in all extremities. No joint effusions.  No lower extremity tenderness.  No edema. Neurologic:   Normal speech and language.  CN 2-10 normal. Motor grossly intact. No gross focal neurologic deficits are appreciated.  Skin:    Skin is warm, dry and intact. No rash noted.  No petechiae, purpura, or bullae.  ____________________________________________    LABS (pertinent positives/negatives) (all labs ordered are listed, but only abnormal results are displayed) Labs Reviewed  BASIC METABOLIC PANEL - Abnormal; Notable for the following:    Sodium 134 (*)    Potassium 3.2 (*)    Glucose, Bld 128 (*)    All other components within normal limits  CBC - Abnormal; Notable for the following:    WBC 11.2 (*)    All other components within normal limits  HCG, QUANTITATIVE, PREGNANCY - Abnormal; Notable for the following:    hCG, Beta Chain, Quant, S [id number] (*)    All other components within normal limits  URINALYSIS COMPLETEWITH MICROSCOPIC ([facility] ONLY) - Abnormal; Notable for the following:    Color, Urine YELLOW (*)    APPearance CLOUDY (*)    Ketones, ur 1+ (*)    Protein, ur 30 (*)    Leukocytes, UA 2+ (*)    Bacteria, UA RARE (*)    Squamous Epithelial / LPF 6-30 (*)    All other components within normal limits  CBG MONITORING, ED  ABO/RH   ____________________________________________   EKG  Interpreted by me Normal sinus  rhythm rate of 89, normal axis intervals QRS ST segments and T waves.  ____________________________________________    RADIOLOGY    ____________________________________________   PROCEDURES   ____________________________________________   INITIAL IMPRESSION / ASSESSMENT AND PLAN / ED COURSE  Pertinent labs & imaging results that were available during my care of the patient were reviewed by me and considered in my medical decision making (see chart for details).  Patient well appearing no acute distress. Mild dehydration with some  ketones in the urine. Given IV D5 normal saline, Zofran, fosfomycin oral for urinary tract infection. After rehydration, patient's nausea is under control and she is tolerating oral intake. We'll discharge home, counseled on importance of maintaining hydration, antiemetics. Follow-up primary care or obstetrics, take prenatal vitamins. No evidence of pyelonephritis or sepsis.     ____________________________________________   FINAL CLINICAL IMPRESSION(S) / ED DIAGNOSES  Final diagnoses:  Urinary tract infection during pregnancy in first trimester  Dehydration       Portions of this note were generated with dragon dictation software. Dictation errors may occur despite best attempts at proofreading.   Sharman Cheek, MD 02/22/16 [phone]

## 2016-02-22 NOTE — ED Notes (Signed)
Patient presents to the ED with dizziness and lightheadedness x 1 week with occasional nausea and vomiting.  Patient reports that she is about [redacted] weeks pregnant.  Patient reports frequent urination with concentrated urine.  Patient states she has been able to keep some fluids down.  Patient is in no obvious distress at this time.  Patient denies pain.

## 2016-02-22 NOTE — Discharge Instructions (Signed)
Dehydration, Adult °Dehydration is a condition in which you do not have enough fluid or water in your body. It happens when you take in less fluid than you lose. Vital organs such as the kidneys, brain, and heart cannot function without a proper amount of fluids. Any loss of fluids from the body can cause dehydration.  °Dehydration can range from mild to severe. This condition should be treated right away to help prevent it from becoming severe. °CAUSES  °This condition may be caused by: °· Vomiting. °· Diarrhea. °· Excessive sweating, such as when exercising in hot or humid weather. °· Not drinking enough fluid during strenuous exercise or during an illness. °· Excessive urine output. °· Fever. °· Certain medicines. °RISK FACTORS °This condition is more likely to develop in: °· People who are taking certain medicines that cause the body to lose excess fluid (diuretics).   °· People who have a chronic illness, such as diabetes, that may increase urination. °· Older adults.   °· People who live at high altitudes.   °· People who participate in endurance sports.   °SYMPTOMS  °Mild Dehydration °· Thirst. °· Dry lips. °· Slightly dry mouth. °· Dry, warm skin. °Moderate Dehydration °· Very dry mouth.   °· Muscle cramps.   °· Dark urine and decreased urine production.   °· Decreased tear production.   °· Headache.   °· Light-headedness, especially when you stand up from a sitting position.   °Severe Dehydration °· Changes in skin.   °· Cold and clammy skin.   °· Skin does not spring back quickly when lightly pinched and released.   °· Changes in body fluids.   °· Extreme thirst.   °· No tears.   °· Not able to sweat when body temperature is high, such as in hot weather.   °· Minimal urine production.   °· Changes in vital signs.   °· Rapid, weak pulse (more than 100 beats per minute when you are sitting still).   °· Rapid breathing.   °· Low blood pressure.   °· Other changes.   °· Sunken eyes.   °· Cold hands and feet.    °· Confusion. °· Lethargy and difficulty being awakened. °· Fainting (syncope).   °· Short-term weight loss.   °· Unconsciousness. °DIAGNOSIS  °This condition may be diagnosed based on your symptoms. You may also have tests to determine how severe your dehydration is. These tests may include:  °· Urine tests.   °· Blood tests.   °TREATMENT  °Treatment for this condition depends on the severity. Mild or moderate dehydration can often be treated at home. Treatment should be started right away. Do not wait until dehydration becomes severe. Severe dehydration needs to be treated at the hospital. °Treatment for Mild Dehydration °· Drinking plenty of water to replace the fluid you have lost.   °· Replacing minerals in your blood (electrolytes) that you may have lost.   °Treatment for Moderate Dehydration  °· Consuming oral rehydration solution (ORS). °Treatment for Severe Dehydration °· Receiving fluid through an IV tube.   °· Receiving electrolyte solution through a feeding tube that is passed through your nose and into your stomach (nasogastric tube or NG tube). °· Correcting any abnormalities in electrolytes. °HOME CARE INSTRUCTIONS  °· Drink enough fluid to keep your urine clear or pale yellow.   °· Drink water or fluid slowly by taking small sips. You can also try sucking on ice cubes.  °· Have food or beverages that contain electrolytes. Examples include bananas and sports drinks. °· Take over-the-counter and prescription medicines only as told by your health care provider.   °· Prepare ORS according to the manufacturer's instructions. Take sips   of ORS every 5 minutes until your urine returns to normal. °· If you have vomiting or diarrhea, continue to try to drink water, ORS, or both.   °· If you have diarrhea, avoid:   °¨ Beverages that contain caffeine.   °¨ Fruit juice.   °¨ Milk.    °¨ Carbonated soft drinks. °· Do not take salt tablets. This can lead to the condition of having too much sodium in your body  (hypernatremia).   °SEEK MEDICAL CARE IF: °· You cannot eat or drink without vomiting. °· You have had moderate diarrhea during a period of more than 24 hours. °· You have a fever. °SEEK IMMEDIATE MEDICAL CARE IF:  °· You have extreme thirst. °· You have severe diarrhea. °· You have not urinated in 6-8 hours, or you have urinated only a small amount of very dark urine. °· You have shriveled skin. °· You are dizzy, confused, or both. °  °This information is not intended to replace advice given to you by your health care provider. Make sure you discuss any questions you have with your health care provider. °  °Document Released: 10/19/2005 Document Revised: 07/10/2015 Document Reviewed: 03/06/2015 °Elsevier Interactive Patient Education ©2016 [employer] ° °Pregnancy and Urinary Tract Infection °A urinary tract infection (UTI) is a bacterial infection of the urinary tract. Infection of the urinary tract can include the ureters, kidneys (pyelonephritis), bladder (cystitis), and urethra (urethritis). All pregnant women should be screened for bacteria in the urinary tract. Identifying and treating a UTI will decrease the risk of preterm labor and developing more serious infections in both the mother and baby. °CAUSES °Bacteria germs cause almost all UTIs.  °RISK FACTORS °Many factors can increase your chances of getting a UTI during pregnancy. These include: °· Having a short urethra. °· Poor toilet and hygiene habits. °· Sexual intercourse. °· Blockage of urine along the urinary tract. °· Problems with the pelvic muscles or nerves. °· Diabetes. °· Obesity. °· Bladder problems after having several children. °· Previous history of UTI. °SIGNS AND SYMPTOMS  °· Pain, burning, or a stinging feeling when urinating. °· Suddenly feeling the need to urinate right away (urgency). °· Loss of bladder control (urinary incontinence). °· Frequent urination, more than is common with pregnancy. °· Lower abdominal or back  discomfort. °· Cloudy urine. °· Blood in the urine (hematuria). °· Fever.  °When the kidneys are infected, the symptoms may be: °· Back pain. °· Flank pain on the right side more so than the left. °· Fever. °· Chills. °· Nausea. °· Vomiting. °DIAGNOSIS  °A urinary tract infection is usually diagnosed through urine tests. Additional tests and procedures are sometimes done. These may include: °· Ultrasound exam of the kidneys, ureters, bladder, and urethra. °· Looking in the bladder with a lighted tube (cystoscopy). °TREATMENT °Typically, UTIs can be treated with antibiotic medicines.  °HOME CARE INSTRUCTIONS  °· Only take over-the-counter or prescription medicines as directed by your health care provider. If you were prescribed antibiotics, take them as directed. Finish them even if you start to feel better. °· Drink enough fluids to keep your urine clear or pale yellow. °· Do not have sexual intercourse until the infection is gone and your health care provider says it is okay. °· Make sure you are tested for UTIs throughout your pregnancy. These infections often come back.  °Preventing a UTI in the Future °· Practice good toilet habits. Always wipe from front to back. Use the tissue only once. °· Do not hold your urine. Empty your bladder as   soon as possible when the urge comes. °· Do not douche or use deodorant sprays. °· Wash with soap and warm water around the genital area and the anus. °· Empty your bladder before and after sexual intercourse. °· Wear underwear with a cotton crotch. °· Avoid caffeine and carbonated drinks. They can irritate the bladder. °· Drink cranberry juice or take cranberry pills. This may decrease the risk of getting a UTI. °· Do not drink alcohol. °· Keep all your appointments and tests as scheduled.  °SEEK MEDICAL CARE IF:  °· Your symptoms get worse. °· You are still having fevers 2 or more days after treatment begins. °· You have a rash. °· You feel that you are having problems with  medicines prescribed. °· You have abnormal vaginal discharge. °SEEK IMMEDIATE MEDICAL CARE IF:  °· You have back or flank pain. °· You have chills. °· You have blood in your urine. °· You have nausea and vomiting. °· You have contractions of your uterus. °· You have a gush of fluid from the vagina. °MAKE SURE YOU: °· Understand these instructions.   °· Will watch your condition.   °· Will get help right away if you are not doing well or get worse.   °  °This information is not intended to replace advice given to you by your health care provider. Make sure you discuss any questions you have with your health care provider. °  °Document Released: 02/13/2011 Document Revised: 08/09/2013 Document Reviewed: 05/18/2013 °Elsevier Interactive Patient Education ©2016 [employer] ° °

## 2016-02-25 DIAGNOSIS — E669 Obesity, unspecified: Secondary | ICD-10-CM | POA: Insufficient documentation

## 2016-03-17 DIAGNOSIS — Z8279 Family history of other congenital malformations, deformations and chromosomal abnormalities: Secondary | ICD-10-CM | POA: Insufficient documentation

## 2016-08-23 DIAGNOSIS — O99820 Streptococcus B carrier state complicating pregnancy: Secondary | ICD-10-CM | POA: Insufficient documentation

## 2016-09-25 ENCOUNTER — Encounter: Payer: Self-pay | Admitting: Emergency Medicine

## 2016-09-25 ENCOUNTER — Emergency Department
Admission: EM | Admit: 2016-09-25 | Discharge: 2016-09-25 | Disposition: A | Discharge status: Home / Self Care | Payer: Medicaid Other | Attending: Emergency Medicine | Admitting: Emergency Medicine

## 2016-09-25 DIAGNOSIS — J45909 Unspecified asthma, uncomplicated: Secondary | ICD-10-CM | POA: Diagnosis not present

## 2016-09-25 DIAGNOSIS — R109 Unspecified abdominal pain: Secondary | ICD-10-CM | POA: Diagnosis present

## 2016-09-25 DIAGNOSIS — K2970 Gastritis, unspecified, without bleeding: Secondary | ICD-10-CM | POA: Diagnosis not present

## 2016-09-25 DIAGNOSIS — Z79899 Other long term (current) drug therapy: Secondary | ICD-10-CM | POA: Diagnosis not present

## 2016-09-25 DIAGNOSIS — F17290 Nicotine dependence, other tobacco product, uncomplicated: Secondary | ICD-10-CM | POA: Diagnosis not present

## 2016-09-25 LAB — URINALYSIS COMPLETE WITH MICROSCOPIC (ARMC ONLY)
BILIRUBIN URINE: NEGATIVE
Bacteria, UA: NONE SEEN
GLUCOSE, UA: NEGATIVE mg/dL
KETONES UR: NEGATIVE mg/dL
NITRITE: NEGATIVE
PROTEIN: 30 mg/dL — AB
SPECIFIC GRAVITY, URINE: 1.024 (ref 1.005–1.030)
pH: 5 (ref 5.0–8.0)

## 2016-09-25 LAB — LIPASE, BLOOD: Lipase: 28 U/L (ref 11–51)

## 2016-09-25 LAB — CBC
HCT: 39.3 % (ref 35.0–47.0)
HEMOGLOBIN: 13.6 g/dL (ref 12.0–16.0)
MCH: 28.5 pg (ref 26.0–34.0)
MCHC: 34.5 g/dL (ref 32.0–36.0)
MCV: 82.6 fL (ref 80.0–100.0)
PLATELETS: 259 10*3/uL (ref 150–440)
RBC: 4.76 MIL/uL (ref 3.80–5.20)
RDW: 14.8 % — ABNORMAL HIGH (ref 11.5–14.5)
WBC: 7.6 10*3/uL (ref 3.6–11.0)

## 2016-09-25 MED ORDER — RANITIDINE HCL 150 MG PO TABS: 150.0000 mg | ORAL_TABLET | Freq: Two times a day (BID) | ORAL | 1 refills | Status: DC

## 2016-09-25 NOTE — ED Provider Notes (Signed)
[facility] Emergency Department Provider Note   ____________________________________________   I have reviewed the triage vital signs and the nursing notes.   HISTORY  Chief Complaint Abdominal Pain   History limited by: Not Limited   HPI Bethany Hampton is a 25 y.o. female with history of cholecystectomy who presents to the emergency department today because of concern for abdominal pain. The patient states that it is located in the epigastric region. She describes sharp. She had an initial episode this morning. It went away on its own. Later today however she ate some artichoke dip and shortly after had the same pain. She describes it as sharp and severe. No radiation to her back or into her chest. Patient had some associated nausea. Currently the pain has gone away on its own. Patient denies similar symptoms in the past. No fevers. No lower abdominal pain. No change in bowel movements.   Past Medical History:  Diagnosis Date  . Abnormal pap 11/13/11   LSIL  HPV/MILD Dysplasia/CIN 1  . ADHD (attention deficit hyperactivity disorder)   . Asthma   . Dysmenorrhea   . Migraines   . Pneumonia     There are no active problems to display for this patient.   Past Surgical History:  Procedure Laterality Date  . BREAST SURGERY  2005   Rt breast cyst(benign)    Prior to Admission medications   Medication Sig Start Date End Date Taking? Authorizing Provider  albuterol (PROVENTIL HFA;VENTOLIN HFA) 108 (90 BASE) MCG/ACT inhaler Inhale 2 puffs into the lungs every 2 (two) hours as needed for wheezing or shortness of breath (cough). 07/29/12   Wayland SalinasJohn Bednar, MD  ciprofloxacin (CIPRO) 500 MG tablet Take 1 tablet (500 mg total) by mouth 2 (two) times daily. 04/22/13   Garlon HatchetLisa M Sanders, PA-C  ondansetron (ZOFRAN ODT) 4 MG disintegrating tablet Take 1 tablet (4 mg total) by mouth every 8 (eight) hours as needed for nausea. 04/22/13   Garlon HatchetLisa M Sanders, PA-C  ondansetron  (ZOFRAN ODT) 4 MG disintegrating tablet Take 1 tablet (4 mg total) by mouth every 8 (eight) hours as needed for nausea or vomiting. 02/22/16   Sharman CheekPhillip Stafford, MD  Prenatal Multivit-Min-Fe-FA (PRENATAL VITAMINS) 0.8 MG tablet Take 1 tablet by mouth daily. 02/22/16   Sharman CheekPhillip Stafford, MD    Allergies Patient has no known allergies.  Family History  Problem Relation Age of Onset  . Heart failure Mother   . Hypertension Mother   . Hypertension Sister   . Cancer Paternal Grandmother     Social History Social History  Substance Use Topics  . Smoking status: Current Some Day Smoker    Types: Cigars  . Smokeless tobacco: Never Used  . Alcohol use No     Comment: l    Review of Systems  Constitutional: Negative for fever. Cardiovascular: Negative for chest pain. Respiratory: Negative for shortness of breath. Gastrointestinal: Positive for abdominal pain. Genitourinary: Negative for dysuria. Musculoskeletal: Negative for back pain. Skin: Negative for rash. Neurological: Negative for headaches, focal weakness or numbness.  10-point ROS otherwise negative.  ____________________________________________   PHYSICAL EXAM:  VITAL SIGNS: ED Triage Vitals  Enc Vitals Group     BP 124/70     Pulse 48     Resp 18     Temp 97.7     Temp src      SpO2 98   Constitutional: Alert and oriented. Well appearing and in no distress. Eyes: Conjunctivae are normal. Normal extraocular  movements. ENT   Head: Normocephalic and atraumatic.   Nose: No congestion/rhinnorhea.   Mouth/Throat: Mucous membranes are moist.   Neck: No stridor. Hematological/Lymphatic/Immunilogical: No cervical lymphadenopathy. Cardiovascular: Normal rate, regular rhythm.  No murmurs, rubs, or gallops.  Respiratory: Normal respiratory effort without tachypnea nor retractions. Breath sounds are clear and equal bilaterally. No wheezes/rales/rhonchi. Gastrointestinal: Soft and nontender. No distention.   Genitourinary: Deferred Musculoskeletal: Normal range of motion in all extremities. No lower extremity edema. Neurologic:  Normal speech and language. No gross focal neurologic deficits are appreciated.  Skin:  Skin is warm, dry and intact. No rash noted. Psychiatric: Mood and affect are normal. Speech and behavior are normal. Patient exhibits appropriate insight and judgment.  ____________________________________________    LABS (pertinent positives/negatives)  Labs Reviewed  COMPREHENSIVE METABOLIC PANEL - Abnormal; Notable for the following:       Result Value   Calcium 8.4 (*)    Albumin 3.2 (*)    Alkaline Phosphatase 146 (*)    Total Bilirubin <0.1 (*)    All other components within normal limits  CBC - Abnormal; Notable for the following:    RDW 14.8 (*)    All other components within normal limits  URINALYSIS COMPLETEWITH MICROSCOPIC ([facility] ONLY) - Abnormal; Notable for the following:    Color, Urine YELLOW (*)    APPearance CLEAR (*)    Hgb urine dipstick 3+ (*)    Protein, ur 30 (*)    Leukocytes, UA 3+ (*)    Squamous Epithelial / LPF 0-5 (*)    All other components within normal limits  URINE CULTURE  LIPASE, BLOOD     ____________________________________________   EKG  I, Phineas SemenGraydon Nimco Bivens, attending physician, personally viewed and interpreted this EKG  EKG Time: 1514 Rate: 52 Rhythm: sinus bradycardia Axis: normal Intervals: qtc 404 QRS: narrow ST changes: no st elevation Impression: abnormal ekg  ____________________________________________    RADIOLOGY  None  ____________________________________________   PROCEDURES  Procedures  ____________________________________________   INITIAL IMPRESSION / ASSESSMENT AND PLAN / ED COURSE  Pertinent labs & imaging results that were available during my care of the patient were reviewed by me and considered in my medical decision making (see chart for details).  Patient presented with  epigastric pain. Patient is status post cholecystectomy. No current pain and no pain on palpation. Blood work without any elevation of lipase in no concerning elevation of liver functions. No leukocytosis. Urine with some white blood cells however patient without any urinary complains. Will send this out for culture. They think gastritis likely. Discussed this with patient. Will give patient prescription for antacids and information on food choices. ____________________________________________   FINAL CLINICAL IMPRESSION(S) / ED DIAGNOSES  Final diagnoses:  Gastritis, presence of bleeding unspecified, unspecified chronicity, unspecified gastritis type     Note: This dictation was prepared with Dragon dictation. Any transcriptional errors that result from this process are unintentional    Phineas SemenGraydon Daaiel Starlin, MD 09/25/16 1755

## 2016-09-25 NOTE — Discharge Instructions (Signed)
Please seek medical attention for any high fevers, chest pain, shortness of breath, change in behavior, persistent vomiting, bloody stool or any other new or concerning symptoms.  

## 2016-09-25 NOTE — ED Triage Notes (Signed)
Patient presents to the ED via EMS from home.  Patient presents to the ED with intermittent sharp epigastric pain since this morning. Patient states pain causes her to feel dizzy, nauseous and short of breath.  Patient reports that she is 2 weeks post-partum.  Patient is in no obvious distress at this time.

## 2016-09-26 LAB — URINE CULTURE

## 2016-09-27 LAB — COMPREHENSIVE METABOLIC PANEL
ALBUMIN: 3.2 g/dL — AB (ref 3.5–5.0)
ALK PHOS: 146 U/L — AB (ref 38–126)
ALT: 17 U/L (ref 14–54)
ANION GAP: 7 (ref 5–15)
AST: 17 U/L (ref 15–41)
BILIRUBIN TOTAL: 0.2 mg/dL — AB (ref 0.3–1.2)
BUN: 11 mg/dL (ref 6–20)
CALCIUM: 8.4 mg/dL — AB (ref 8.9–10.3)
CO2: 23 mmol/L (ref 22–32)
CREATININE: 0.82 mg/dL (ref 0.44–1.00)
Chloride: 108 mmol/L (ref 101–111)
GFR calc non Af Amer: >60 mL/min (ref >60)
GLUCOSE: 95 mg/dL (ref 65–99)
Potassium: 3.5 mmol/L (ref 3.5–5.1)
Sodium: 138 mmol/L (ref 135–145)
TOTAL PROTEIN: 6.5 g/dL (ref 6.5–8.1)

## 2017-10-25 ENCOUNTER — Observation Stay
Admission: EM | Admit: 2017-10-25 | Discharge: 2017-10-26 | Disposition: A | Discharge status: Home / Self Care | Payer: Medicaid Other | Attending: Surgery | Admitting: Surgery

## 2017-10-25 ENCOUNTER — Emergency Department: Payer: Medicaid Other

## 2017-10-25 ENCOUNTER — Encounter: Payer: Self-pay | Admitting: Emergency Medicine

## 2017-10-25 ENCOUNTER — Other Ambulatory Visit: Payer: Self-pay

## 2017-10-25 DIAGNOSIS — F17290 Nicotine dependence, other tobacco product, uncomplicated: Secondary | ICD-10-CM | POA: Insufficient documentation

## 2017-10-25 DIAGNOSIS — N611 Abscess of the breast and nipple: Secondary | ICD-10-CM | POA: Diagnosis not present

## 2017-10-25 DIAGNOSIS — J45909 Unspecified asthma, uncomplicated: Secondary | ICD-10-CM | POA: Diagnosis not present

## 2017-10-25 DIAGNOSIS — N644 Mastodynia: Secondary | ICD-10-CM | POA: Insufficient documentation

## 2017-10-25 DIAGNOSIS — Z79899 Other long term (current) drug therapy: Secondary | ICD-10-CM | POA: Diagnosis not present

## 2017-10-25 DIAGNOSIS — R609 Edema, unspecified: Secondary | ICD-10-CM

## 2017-10-25 LAB — CBC
HCT: 45.7 % (ref 35.0–47.0)
Hemoglobin: 15.1 g/dL (ref 12.0–16.0)
MCH: 28.9 pg (ref 26.0–34.0)
MCHC: 33 g/dL (ref 32.0–36.0)
MCV: 87.7 fL (ref 80.0–100.0)
Platelets: 285 10*3/uL (ref 150–440)
RBC: 5.21 MIL/uL — ABNORMAL HIGH (ref 3.80–5.20)
RDW: 13.8 % (ref 11.5–14.5)
WBC: 13.1 10*3/uL — ABNORMAL HIGH (ref 3.6–11.0)

## 2017-10-25 LAB — BASIC METABOLIC PANEL
Anion gap: 12 (ref 5–15)
BUN: 8 mg/dL (ref 6–20)
CALCIUM: 8.9 mg/dL (ref 8.9–10.3)
CHLORIDE: 102 mmol/L (ref 101–111)
CO2: 23 mmol/L (ref 22–32)
CREATININE: 0.84 mg/dL (ref 0.44–1.00)
GFR calc non Af Amer: >60 mL/min (ref >60)
GLUCOSE: 100 mg/dL — AB (ref 65–99)
Potassium: 2.8 mmol/L — ABNORMAL LOW (ref 3.5–5.1)
Sodium: 137 mmol/L (ref 135–145)

## 2017-10-25 MED ORDER — SODIUM CHLORIDE 0.9 % IV BOLUS (SEPSIS): 1000.0000 mL | Freq: Once | INTRAVENOUS | Status: DC

## 2017-10-25 MED ORDER — ONDANSETRON 4 MG PO TBDP: 4.0000 mg | ORAL_TABLET | Freq: Four times a day (QID) | ORAL | Status: DC | PRN

## 2017-10-25 MED ORDER — SODIUM CHLORIDE 0.9 % IV BOLUS (SEPSIS)
1000.0000 mL | Freq: Once | INTRAVENOUS | Status: AC
  Administered 2017-10-25: 1000 mL via INTRAVENOUS

## 2017-10-25 MED ORDER — MORPHINE SULFATE (PF) 2 MG/ML IV SOLN
2.0000 mg | INTRAVENOUS | Status: DC | PRN
Start: 2017-10-25 — End: 2017-10-26
  Administered 2017-10-25: 2 mg via INTRAVENOUS
  Filled 2017-10-25: qty 1

## 2017-10-25 MED ORDER — ACETAMINOPHEN 500 MG PO TABS
1000.0000 mg | ORAL_TABLET | ORAL | Status: AC
  Administered 2017-10-25: 1000 mg via ORAL
  Filled 2017-10-25: qty 2

## 2017-10-25 MED ORDER — KETOROLAC TROMETHAMINE 30 MG/ML IJ SOLN: 30.0000 mg | Freq: Four times a day (QID) | INTRAMUSCULAR | Status: DC | PRN

## 2017-10-25 MED ORDER — KETOROLAC TROMETHAMINE 30 MG/ML IJ SOLN
30.0000 mg | Freq: Four times a day (QID) | INTRAMUSCULAR | Status: DC
  Administered 2017-10-26: 30 mg via INTRAVENOUS
  Filled 2017-10-25: qty 1

## 2017-10-25 MED ORDER — CLINDAMYCIN PHOSPHATE 600 MG/50ML IV SOLN
600.0000 mg | Freq: Once | INTRAVENOUS | Status: AC
  Administered 2017-10-25: 600 mg via INTRAVENOUS
  Filled 2017-10-25: qty 50

## 2017-10-25 MED ORDER — INFLUENZA VAC SPLIT QUAD 0.5 ML IM SUSY: 0.5000 mL | PREFILLED_SYRINGE | INTRAMUSCULAR | Status: DC

## 2017-10-25 MED ORDER — PANTOPRAZOLE SODIUM 40 MG IV SOLR
40.0000 mg | Freq: Every day | INTRAVENOUS | Status: DC
  Administered 2017-10-25: 40 mg via INTRAVENOUS
  Filled 2017-10-25: qty 40

## 2017-10-25 MED ORDER — LACTATED RINGERS IV SOLN
INTRAVENOUS | Status: DC
  Administered 2017-10-25: 23:00:00 via INTRAVENOUS

## 2017-10-25 MED ORDER — HEPARIN SODIUM (PORCINE) 5000 UNIT/ML IJ SOLN
5000.0000 [IU] | Freq: Three times a day (TID) | INTRAMUSCULAR | Status: DC
  Administered 2017-10-25: 5000 [IU] via SUBCUTANEOUS
  Filled 2017-10-25: qty 1

## 2017-10-25 MED ORDER — ONDANSETRON HCL 4 MG/2ML IJ SOLN: 4.0000 mg | Freq: Four times a day (QID) | INTRAMUSCULAR | Status: DC | PRN

## 2017-10-25 MED ORDER — ACETAMINOPHEN 500 MG PO TABS
1000.0000 mg | ORAL_TABLET | Freq: Four times a day (QID) | ORAL | Status: DC
  Administered 2017-10-26: 1000 mg via ORAL
  Filled 2017-10-25 (×2): qty 2

## 2017-10-25 MED ORDER — VANCOMYCIN HCL IN DEXTROSE 1-5 GM/200ML-% IV SOLN
1000.0000 mg | Freq: Once | INTRAVENOUS | Status: AC
  Administered 2017-10-25: 1000 mg via INTRAVENOUS
  Filled 2017-10-25: qty 200

## 2017-10-25 MED ORDER — IBUPROFEN 600 MG PO TABS
600.0000 mg | ORAL_TABLET | ORAL | Status: AC
  Administered 2017-10-25: 600 mg via ORAL
  Filled 2017-10-25: qty 1

## 2017-10-25 MED ORDER — POTASSIUM CHLORIDE 10 MEQ/100ML IV SOLN
10.0000 meq | INTRAVENOUS | Status: AC
  Administered 2017-10-25: 10 meq via INTRAVENOUS
  Filled 2017-10-25: qty 100

## 2017-10-25 MED ORDER — AMPICILLIN-SULBACTAM SODIUM 3 (2-1) G IJ SOLR
3.0000 g | Freq: Four times a day (QID) | INTRAMUSCULAR | Status: DC
  Administered 2017-10-25 – 2017-10-26 (×2): 3 g via INTRAVENOUS
  Filled 2017-10-25 (×5): qty 3

## 2017-10-25 MED ORDER — POTASSIUM CHLORIDE CRYS ER 20 MEQ PO TBCR
40.0000 meq | EXTENDED_RELEASE_TABLET | Freq: Once | ORAL | Status: AC
  Administered 2017-10-25: 40 meq via ORAL
  Filled 2017-10-25: qty 2

## 2017-10-25 NOTE — ED Notes (Signed)
ED Provider at bedside, with this RN.

## 2017-10-25 NOTE — ED Notes (Addendum)
Pt has an ear infection post 2 wks ago. Pt stated the swelling in the right breast by nipple started out 2 wks ago and has just gotten bigger. Pt states it has d/c is a greenish color that started yesterday. Pt states she cleaned it with peroxide, and had sx on the breast 14 yrs for begin cyst removed.Pt denies fever chills. Pt is A/O x4 NAD awaiting EDP.

## 2017-10-25 NOTE — ED Notes (Signed)
Pt gone to US

## 2017-10-25 NOTE — ED Triage Notes (Signed)
Pt reports abscess to right nipple area that has increased in size. Pt reports area is red, inflamed and painful. Ambulatory to triage. No apparent distress noted.

## 2017-10-25 NOTE — ED Provider Notes (Signed)
[facility] Emergency Department Provider Note  ____________________________________________   First MD Initiated Contact with Patient 10/25/17 1810     (approximate)  I have reviewed the triage vital signs and the nursing notes.   HISTORY  Chief Complaint Abscess    HPI Bethany Hampton is a 26 y.o. female presents for evaluation of right breast pain and some purulent drainage.  Patient reports around [place] 3 expressing discomfort underneath the nipple of the right breast, over the last couple days increased swelling and then today she is noticed some slight purulent drainage and feels a very swollen area underneath the right nipple.  No fevers or chills.  No nausea or vomiting.  Reports previous infections many years ago involving the right breast after a nipple ring was placed which she no longer uses.  She breast fed about 9-10 months ago, but has not done so for at least 9 months.  No fevers or chills.  No nausea vomiting.  No pain or discomfort anywhere else.  No trouble breathing  Achy pain right breast with swelling.  Moderate intensity  Past Medical History:  Diagnosis Date  . Abnormal pap 11/13/11   LSIL  HPV/MILD Dysplasia/CIN 1  . ADHD (attention deficit hyperactivity disorder)   . Asthma   . Dysmenorrhea   . Migraines   . Pneumonia     There are no active problems to display for this patient.   Past Surgical History:  Procedure Laterality Date  . BREAST SURGERY  2005   Rt breast cyst(benign)    Prior to Admission medications   Medication Sig Start Date End Date Taking? Authorizing Provider  albuterol (PROVENTIL HFA;VENTOLIN HFA) 108 (90 BASE) MCG/ACT inhaler Inhale 2 puffs into the lungs every 2 (two) hours as needed for wheezing or shortness of breath (cough). Patient not taking: Reported on 10/25/2017 07/29/12   Wayland Salinas, MD  ciprofloxacin (CIPRO) 500 MG tablet Take 1 tablet (500 mg total) by mouth 2 (two) times daily. Patient  not taking: Reported on 10/25/2017 04/22/13   Garlon Hatchet, PA-C  ondansetron (ZOFRAN ODT) 4 MG disintegrating tablet Take 1 tablet (4 mg total) by mouth every 8 (eight) hours as needed for nausea. Patient not taking: Reported on 10/25/2017 04/22/13   Garlon Hatchet, PA-C  ondansetron (ZOFRAN ODT) 4 MG disintegrating tablet Take 1 tablet (4 mg total) by mouth every 8 (eight) hours as needed for nausea or vomiting. Patient not taking: Reported on 10/25/2017 02/22/16   Sharman Cheek, MD  Prenatal Multivit-Min-Fe-FA (PRENATAL VITAMINS) 0.8 MG tablet Take 1 tablet by mouth daily. Patient not taking: Reported on 10/25/2017 02/22/16   Sharman Cheek, MD  ranitidine (ZANTAC) 150 MG tablet Take 1 tablet (150 mg total) by mouth 2 (two) times daily. 09/25/16 09/25/17  Phineas Semen, MD    Allergies Patient has no known allergies.  Family History  Problem Relation Age of Onset  . Heart failure Mother   . Hypertension Mother   . Hypertension Sister   . Cancer Paternal Grandmother     Social History Social History   Tobacco Use  . Smoking status: Current Some Day Smoker    Types: Cigars  . Smokeless tobacco: Never Used  Substance Use Topics  . Alcohol use: No    Comment: l  . Drug use: No    Review of Systems Constitutional: No fever/chills Eyes: No visual changes. ENT: No sore throat. Cardiovascular: Denies chest pain. Respiratory: Denies shortness of breath. Gastrointestinal: No abdominal pain.  No nausea, no vomiting.  No diarrhea.  No constipation. Genitourinary: Negative for dysuria.  Denies pregnancy reports her period was about 2 weeks ago. Musculoskeletal: Negative for back pain. Skin: See HPI Neurological: Negative for headaches, focal weakness or numbness.    ____________________________________________   PHYSICAL EXAM:  VITAL SIGNS: ED Triage Vitals  Enc Vitals Group     BP 10/25/17 1732 (!) 148/80     Pulse Rate 10/25/17 1732 (!) 109     Resp  10/25/17 1732 18     Temp 10/25/17 1732 98.4 F (36.9 C)     Temp Source 10/25/17 1732 Oral     SpO2 10/25/17 1732 100 %     Weight 10/25/17 1733 200 lb (90.7 kg)     Height 10/25/17 1733 5\' 8"  (1.727 m)     Head Circumference --      Peak Flow --      Pain Score 10/25/17 1732 9     Pain Loc --      Pain Edu? --      Excl. in GC? --     Constitutional: Alert and oriented. Well appearing and in no acute distress. Eyes: Conjunctivae are normal. Head: Atraumatic. Nose: No congestion/rhinnorhea. Mouth/Throat: Mucous membranes are moist. Neck: No stridor.   Cardiovascular: Normal rate, regular rhythm. Grossly normal heart sounds.  Good peripheral circulation.  Examined with nurse present, the patient has tenderness with a palpable mass below the areole of the right nipple.  It is firm to slightly indurated.  Tender to the touch with some overlying erythema of the breast tissue in the nipple region.  A slight amount of purulence is noted at the right nipple complex Respiratory: Normal respiratory effort.  No retractions. Lungs CTAB. Gastrointestinal: Soft and nontender. No distention. Musculoskeletal: No lower extremity tenderness nor edema. Neurologic:  Normal speech and language. No gross focal neurologic deficits are appreciated.  Skin:  Skin is warm, dry and intact. No rash noted. Psychiatric: Mood and affect are normal. Speech and behavior are normal.  ____________________________________________   LABS (all labs ordered are listed, but only abnormal results are displayed)  Labs Reviewed  CBC - Abnormal; Notable for the following components:      Result Value   WBC 13.1 (*)    RBC 5.21 (*)    All other components within normal limits  BASIC METABOLIC PANEL - Abnormal; Notable for the following components:   Potassium 2.8 (*)    Glucose, Bld 100 (*)    All other components within normal limits  CULTURE, BLOOD (ROUTINE X 2)  CULTURE, BLOOD (ROUTINE X 2)  POC URINE PREG, ED     ____________________________________________  EKG   ____________________________________________  RADIOLOGY  [place] Breast Ltd Uni Right Inc Axilla  Result Date: 10/25/2017 CLINICAL DATA:  Patient presents to the ER with pain, swelling with palpable mass and drainage 1 week in the right retroareolar/ periareolar region. Exam is performed on an emergency basis via the ultrasound technologist to assess for abscess. EXAM: ULTRASOUND OF THE RIGHT BREAST COMPARISON:  None. FINDINGS: Targeted ultrasound is performed, showing an oval heterogeneous hypoechoic mass with through transmission likely a complex fluid collection in the 9 o'clock position of the right retroareolar region measuring 3.2 x 3.2 x 3.6 cm. There is associated skin thickening. In keeping with patient's clinical presentation, this likely represents an abscess. Technologist reports break in the skin with mild purulent drainage near the base of the nipple at the 9 o'clock location of the  areola. There is mild vascularity around the periphery of this mass but no internal vascularity. IMPRESSION: Complex fluid collection in the right retroareolar region best seen from the 9 o'clock position measuring 3.2 x 3.2 x 3.6 cm likely representing an abscess. RECOMMENDATION: Recommend aspiration as well as a 7 day course of antibiotics (doxycycline) and follow-up ultrasound evaluation in 1-2 weeks. I have discussed the findings and recommendations with the patient. Results were also provided in writing at the conclusion of the visit. If applicable, a reminder letter will be sent to the patient regarding the next appointment. BI-RADS CATEGORY  3: Probably benign. These results were called by telephone at the time of interpretation on 10/25/2017 at 8:40 pm to Dr. Sharyn CreamerMARK Zhanae Proffit , who verbally acknowledged these results. Electronically Signed   By: Elberta Fortisaniel  Boyle M.D.   On: 10/25/2017 20:42    Ultrasound reviewed, concerning for possible abscess.  Right  breast ____________________________________________   PROCEDURES  Procedure(s) performed: None  Procedures  Critical Care performed: No  ____________________________________________   INITIAL IMPRESSION / ASSESSMENT AND PLAN / ED COURSE  Pertinent labs & imaging results that were available during my care of the patient were reviewed by me and considered in my medical decision making (see chart for details).  History labs consistent with probable right breast abscess.  Discussed with radiology, of also discussed with general surgery Dr. Everlene FarrierPabon.  General surgery is seen and evaluated the patient, they will be admitting her for ongoing care and management  Patient agreeable with plan for admission.  Resting comfortably.  Admitting to general surgery      ____________________________________________   FINAL CLINICAL IMPRESSION(S) / ED DIAGNOSES  Final diagnoses:  Swelling  Breast abscess      NEW MEDICATIONS STARTED DURING THIS VISIT:  This SmartLink is deprecated. Use AVSMEDLIST instead to display the medication list for a patient.   Note:  This document was prepared using Dragon voice recognition software and may include unintentional dictation errors.     Sharyn CreamerQuale, Nafis Farnan, MD 10/25/17 2117

## 2017-10-25 NOTE — ED Notes (Signed)
Consent signed by pt and this RN

## 2017-10-25 NOTE — H&P (Signed)
Patient ID: Bethany Hampton, female   DOB: 01/16/1991, 26 y.o.   MRN: [id number]  HPI Bethany Hampton is a 26 y.o. female comes in to the emergency room with a 1 week history of right breast pain. She reports initially pain was dull intermittent and mild but now over the last 3 days he has become sharp severe and constant. Patient reports that the pain worsens when she moves and also when she wears a bra. No fevers or chills. She does have some purulent discharge over the last couple days. She did have a history of a breast abscess several years ago. She was breast-feeding up to 9 months ago. She smokes 2 cigars a day. No drugs or alcohol. She is able to perform more than 6 Mets without any shortness of breath or chest pain. Ultrasound of the right breast personal review there is evidence of at least 3 .5  centimeter collection system with a complex abscess. White count is 13,000 and her potassium is 2.8. NML Creat. He did have A whopper  and fries around 5 PM tonight  HPI  Past Medical History:  Diagnosis Date  . Abnormal pap 11/13/11   LSIL  HPV/MILD Dysplasia/CIN 1  . ADHD (attention deficit hyperactivity disorder)   . Asthma   . Dysmenorrhea   . Migraines   . Pneumonia     Past Surgical History:  Procedure Laterality Date  . BREAST SURGERY  2005   Rt breast cyst(benign)    Family History  Problem Relation Age of Onset  . Heart failure Mother   . Hypertension Mother   . Hypertension Sister   . Cancer Paternal Grandmother     Social History Social History   Tobacco Use  . Smoking status: Current Some Day Smoker    Types: Cigars  . Smokeless tobacco: Never Used  Substance Use Topics  . Alcohol use: No    Comment: l  . Drug use: No    No Known Allergies  Current Facility-Administered Medications  Medication Dose Route Frequency Provider Last Rate Last Dose  . [START ON 10/26/2017] acetaminophen (TYLENOL) tablet 1,000 mg  1,000 mg Oral Q6H Pabon, Diego F, MD      .  Ampicillin-Sulbactam (UNASYN) 3 g in sodium chloride 0.9 % 100 mL IVPB  3 g Intravenous Q6H Pabon, Diego F, MD      . clindamycin (CLEOCIN) IVPB 600 mg  600 mg Intravenous Once Sharyn Creamer, MD      . heparin injection 5,000 Units  5,000 Units Subcutaneous Q8H Pabon, Diego F, MD      . Melene Muller ON 10/26/2017] ketorolac (TORADOL) 30 MG/ML injection 30 mg  30 mg Intravenous Q6H Pabon, Diego F, MD       Followed by  . [START ON 10/31/2017] ketorolac (TORADOL) 30 MG/ML injection 30 mg  30 mg Intravenous Q6H PRN Pabon, Diego F, MD      . lactated ringers infusion   Intravenous Continuous Pabon, Diego F, MD      . morphine 2 MG/ML injection 2 mg  2 mg Intravenous Q2H PRN Pabon, Diego F, MD      . ondansetron (ZOFRAN-ODT) disintegrating tablet 4 mg  4 mg Oral Q6H PRN Pabon, Diego F, MD       Or  . ondansetron (ZOFRAN) injection 4 mg  4 mg Intravenous Q6H PRN Pabon, Diego F, MD      . pantoprazole (PROTONIX) injection 40 mg  40 mg Intravenous QHS Pabon, Merri Ray, MD      .  potassium chloride 10 mEq in 100 mL IVPB  10 mEq Intravenous Q1 Hr x 2 Pabon, Diego F, MD      . sodium chloride 0.9 % bolus 1,000 mL  1,000 mL Intravenous Once Pabon, Merri Rayiego F, MD       Current Outpatient Medications  Medication Sig Dispense Refill  . albuterol (PROVENTIL HFA;VENTOLIN HFA) 108 (90 BASE) MCG/ACT inhaler Inhale 2 puffs into the lungs every 2 (two) hours as needed for wheezing or shortness of breath (cough). (Patient not taking: Reported on 10/25/2017) 1 Inhaler 0  . ciprofloxacin (CIPRO) 500 MG tablet Take 1 tablet (500 mg total) by mouth 2 (two) times daily. (Patient not taking: Reported on 10/25/2017) 14 tablet 0  . ondansetron (ZOFRAN ODT) 4 MG disintegrating tablet Take 1 tablet (4 mg total) by mouth every 8 (eight) hours as needed for nausea. (Patient not taking: Reported on 10/25/2017) 10 tablet 0  . ondansetron (ZOFRAN ODT) 4 MG disintegrating tablet Take 1 tablet (4 mg total) by mouth every 8 (eight) hours as needed  for nausea or vomiting. (Patient not taking: Reported on 10/25/2017) 20 tablet 0  . Prenatal Multivit-Min-Fe-FA (PRENATAL VITAMINS) 0.8 MG tablet Take 1 tablet by mouth daily. (Patient not taking: Reported on 10/25/2017) 90 tablet 3  . ranitidine (ZANTAC) 150 MG tablet Take 1 tablet (150 mg total) by mouth 2 (two) times daily. 60 tablet 1     Review of Systems Full ROS  was asked and was negative except for the information on the HPI  Physical Exam Blood pressure 118/80, pulse 78, temperature 98.4 F (36.9 C), temperature source Oral, resp. rate 20, height 5\' 8"  (1.727 m), weight 90.7 kg (200 lb), last menstrual period 10/18/2017, SpO2 99 %, unknown if currently breastfeeding. CONSTITUTIONAL: NAD, alert EYES: Pupils are equal, round, and reactive to light, Sclera are non-icteric. EARS, NOSE, MOUTH AND THROAT: The oropharynx is clear. The oral mucosa is pink and moist. Hearing is intact to voice. LYMPH NODES:  Lymph nodes in the neck are normal. BREAST: There is an obvious area of induration involving the the area all area and retracted our areolar region. There is blanching erythema and fluctuance, fluctuant area measures around 5 cm in size and is very tender to palpation. There are no other masses and the axillas are free of any lymphadenopathy. Contralateral breast is nml RESPIRATORY:  Lungs are clear. There is normal respiratory effort, with equal breath sounds bilaterally, and without pathologic use of accessory muscles. CARDIOVASCULAR: Heart is regular without murmurs, gallops, or rubs. GI: The abdomen is soft, nontender, and nondistended. There are no palpable masses. There is no hepatosplenomegaly. There are normal bowel sounds in all quadrants. GU: Rectal deferred.   MUSCULOSKELETAL: Normal muscle strength and tone. No cyanosis or edema.   SKIN: Turgor is good and there are no pathologic skin lesions or ulcers. NEUROLOGIC: Motor and sensation is grossly normal. Cranial nerves are  grossly intact. PSYCH:  Oriented to person, place and time. Affect is normal.  Data Reviewed  I have personally reviewed the patient's imaging, laboratory findings and medical records.    Assessment/Plan Right breast abscess in need for I/D. gust with the patient in detail about her disease process and the need for surgery. Risks benefits and possible complications and but not limited to: Bleeding, recurrence, prolonged wound healing, persistent pain She understands and wishes to proceed. We'll go ahead and start resuscitation with crystalloids, correct potassium and restart her home broad-spectrum antibiotics to cover MRSA is and  gram negatives. We will recheck a BMP around 1 AM in the morning after an additional round of potassium IV will be administered. Case discussed in detail with the anesthesia team  and Dr. Fanny BienQuale from the ER   Sterling Bigiego Pabon, MD [facility] General Surgeon 10/25/2017, 9:39 PM

## 2017-10-26 ENCOUNTER — Observation Stay: Payer: Medicaid Other | Admitting: Anesthesiology

## 2017-10-26 ENCOUNTER — Encounter
Admission: EM | Disposition: A | Discharge status: Home / Self Care | Payer: Self-pay | Source: Home / Self Care | Attending: Emergency Medicine

## 2017-10-26 DIAGNOSIS — N611 Abscess of the breast and nipple: Secondary | ICD-10-CM | POA: Diagnosis not present

## 2017-10-26 HISTORY — PX: INCISION AND DRAINAGE ABSCESS: SHX5864

## 2017-10-26 LAB — BASIC METABOLIC PANEL
ANION GAP: 4 — AB (ref 5–15)
BUN: 9 mg/dL (ref 6–20)
CALCIUM: 8.1 mg/dL — AB (ref 8.9–10.3)
CO2: 25 mmol/L (ref 22–32)
CREATININE: 0.7 mg/dL (ref 0.44–1.00)
Chloride: 105 mmol/L (ref 101–111)
GFR calc Af Amer: >60 mL/min (ref >60)
GLUCOSE: 96 mg/dL (ref 65–99)
Potassium: 3.5 mmol/L (ref 3.5–5.1)
Sodium: 134 mmol/L — ABNORMAL LOW (ref 135–145)

## 2017-10-26 LAB — SURGICAL PCR SCREEN
MRSA, PCR: NEGATIVE
Staphylococcus aureus: NEGATIVE

## 2017-10-26 SURGERY — INCISION AND DRAINAGE, ABSCESS
Anesthesia: General | Site: Breast | Laterality: Right | Wound class: Dirty or Infected

## 2017-10-26 MED ORDER — PROPOFOL 10 MG/ML IV BOLUS
INTRAVENOUS | Status: AC
  Filled 2017-10-26: qty 20

## 2017-10-26 MED ORDER — OXYCODONE HCL 5 MG PO TABS: 5.0000 mg | ORAL_TABLET | Freq: Once | ORAL | Status: DC | PRN

## 2017-10-26 MED ORDER — MIDAZOLAM HCL 2 MG/2ML IJ SOLN
INTRAMUSCULAR | Status: DC | PRN
  Administered 2017-10-26: 2 mg via INTRAVENOUS

## 2017-10-26 MED ORDER — LIDOCAINE HCL (CARDIAC) 20 MG/ML IV SOLN
INTRAVENOUS | Status: DC | PRN
  Administered 2017-10-26: 100 mg via INTRAVENOUS

## 2017-10-26 MED ORDER — FENTANYL CITRATE (PF) 100 MCG/2ML IJ SOLN
25.0000 ug | INTRAMUSCULAR | Status: DC | PRN
  Administered 2017-10-26 (×3): 50 ug via INTRAVENOUS

## 2017-10-26 MED ORDER — FENTANYL CITRATE (PF) 100 MCG/2ML IJ SOLN
INTRAMUSCULAR | Status: DC | PRN
  Administered 2017-10-26: 50 ug via INTRAVENOUS

## 2017-10-26 MED ORDER — VANCOMYCIN HCL IN DEXTROSE 1-5 GM/200ML-% IV SOLN
1000.0000 mg | Freq: Three times a day (TID) | INTRAVENOUS | Status: DC
  Administered 2017-10-26: 1000 mg via INTRAVENOUS
  Filled 2017-10-26 (×3): qty 200

## 2017-10-26 MED ORDER — FENTANYL CITRATE (PF) 100 MCG/2ML IJ SOLN
INTRAMUSCULAR | Status: AC
  Filled 2017-10-26: qty 2

## 2017-10-26 MED ORDER — AMOXICILLIN-POT CLAVULANATE 875-125 MG PO TABS: 1.0000 | ORAL_TABLET | Freq: Two times a day (BID) | ORAL | 0 refills | Status: AC

## 2017-10-26 MED ORDER — PROPOFOL 10 MG/ML IV BOLUS
INTRAVENOUS | Status: AC
Start: 2017-10-26 — End: ?
  Filled 2017-10-26: qty 20

## 2017-10-26 MED ORDER — LACTATED RINGERS IV SOLN
INTRAVENOUS | Status: DC | PRN
  Administered 2017-10-26: 05:00:00 via INTRAVENOUS

## 2017-10-26 MED ORDER — LIDOCAINE HCL (PF) 2 % IJ SOLN
INTRAMUSCULAR | Status: AC
  Filled 2017-10-26: qty 10

## 2017-10-26 MED ORDER — PROMETHAZINE HCL 25 MG/ML IJ SOLN: 6.2500 mg | INTRAMUSCULAR | Status: DC | PRN

## 2017-10-26 MED ORDER — MEPERIDINE HCL 25 MG/ML IJ SOLN: 6.2500 mg | INTRAMUSCULAR | Status: DC | PRN

## 2017-10-26 MED ORDER — AMOXICILLIN-POT CLAVULANATE 875-125 MG PO TABS: 1.0000 | ORAL_TABLET | Freq: Two times a day (BID) | ORAL | Status: DC

## 2017-10-26 MED ORDER — PROPOFOL 10 MG/ML IV BOLUS
INTRAVENOUS | Status: DC | PRN
  Administered 2017-10-26: 200 mg via INTRAVENOUS

## 2017-10-26 MED ORDER — HYDROCODONE-ACETAMINOPHEN 5-325 MG PO TABS: 1.0000 | ORAL_TABLET | Freq: Four times a day (QID) | ORAL | 0 refills | Status: DC | PRN

## 2017-10-26 MED ORDER — OXYCODONE HCL 5 MG/5ML PO SOLN: 5.0000 mg | Freq: Once | ORAL | Status: DC | PRN

## 2017-10-26 MED ORDER — MIDAZOLAM HCL 2 MG/2ML IJ SOLN
INTRAMUSCULAR | Status: AC
  Filled 2017-10-26: qty 2

## 2017-10-26 MED ORDER — AMOXICILLIN-POT CLAVULANATE 875-125 MG PO TABS
1.0000 | ORAL_TABLET | Freq: Once | ORAL | Status: AC
  Administered 2017-10-26: 1 via ORAL
  Filled 2017-10-26: qty 1

## 2017-10-26 MED ORDER — HYDROCODONE-ACETAMINOPHEN 5-325 MG PO TABS
1.0000 | ORAL_TABLET | ORAL | Status: DC | PRN
  Administered 2017-10-26: 2 via ORAL
  Filled 2017-10-26: qty 2

## 2017-10-26 MED ORDER — SUCCINYLCHOLINE CHLORIDE 20 MG/ML IJ SOLN
INTRAMUSCULAR | Status: AC
  Filled 2017-10-26: qty 1

## 2017-10-26 SURGICAL SUPPLY — 18 items
BLADE CLIPPER SURG (BLADE) ×3 IMPLANT
BLADE SURG 15 STRL LF DISP TIS (BLADE) ×1 IMPLANT
BLADE SURG 15 STRL SS (BLADE) ×2
BRUSH SCRUB EZ  4% CHG (MISCELLANEOUS) ×2
BRUSH SCRUB EZ 4% CHG (MISCELLANEOUS) ×1 IMPLANT
CANISTER SUCT 3000ML PPV (MISCELLANEOUS) ×3 IMPLANT
DRAPE LAPAROTOMY 77X122 PED (DRAPES) ×3 IMPLANT
ELECT REM PT RETURN 9FT ADLT (ELECTROSURGICAL) ×3
ELECTRODE REM PT RTRN 9FT ADLT (ELECTROSURGICAL) ×1 IMPLANT
GLOVE BIO SURGEON STRL SZ7 (GLOVE) ×3 IMPLANT
GOWN STRL REUS W/ TWL LRG LVL3 (GOWN DISPOSABLE) ×2 IMPLANT
GOWN STRL REUS W/TWL LRG LVL3 (GOWN DISPOSABLE) ×4
NEEDLE HYPO 22GX1.5 SAFETY (NEEDLE) ×3 IMPLANT
NS IRRIG 1000ML POUR BTL (IV SOLUTION) ×3 IMPLANT
PACK BASIN MINOR ARMC (MISCELLANEOUS) ×3 IMPLANT
SOL PREP PVP 2OZ (MISCELLANEOUS) ×3
SOLUTION PREP PVP 2OZ (MISCELLANEOUS) ×1 IMPLANT
SPONGE LAP 18X18 5 PK (GAUZE/BANDAGES/DRESSINGS) ×3 IMPLANT

## 2017-10-26 NOTE — Transfer of Care (Signed)
Immediate Anesthesia Transfer of Care Note  Patient: Bethany Hampton  Procedure(s) Performed: INCISION AND DRAINAGE ABSCESS (Right Breast)  Patient Location: PACU  Anesthesia Type:General  Level of Consciousness: sedated  Airway & Oxygen Therapy: Patient Spontanous Breathing  Post-op Assessment: Report given to RN and Post -op Vital signs reviewed and stable  Post vital signs: Reviewed and stable  Last Vitals:  Vitals:   10/25/17 2223 10/26/17 0518  BP: 129/77 113/76  Pulse: 79 81  Resp: 20 14  Temp: 36.4 C (!) 36.1 C  SpO2: 100% 96%    Last Pain:  Vitals:   10/26/17 0518  TempSrc:   PainSc: Asleep      Patients Stated Pain Goal: 0 (10/25/17 2309)  Complications: No apparent anesthesia complications

## 2017-10-26 NOTE — Anesthesia Preprocedure Evaluation (Signed)
Anesthesia Evaluation  Patient identified by MRN, date of birth, ID band Patient awake    Reviewed: Allergy & Precautions, NPO status , Patient's Chart, lab work & pertinent test results  History of Anesthesia Complications Negative for: history of anesthetic complications  Airway Mallampati: III  TM Distance: >3 FB Neck ROM: Full    Dental no notable dental hx.    Pulmonary asthma (mild, does not use any inhalers) , Current Smoker,    breath sounds clear to auscultation- rhonchi (-) wheezing      Cardiovascular Exercise Tolerance: Good (-) hypertension(-) CAD, (-) Past MI and (-) Cardiac Stents  Rhythm:Regular Rate:Normal - Systolic murmurs and - Diastolic murmurs    Neuro/Psych  Headaches, PSYCHIATRIC DISORDERS (ADHD)    GI/Hepatic negative GI ROS, Neg liver ROS,   Endo/Other  negative endocrine ROSneg diabetes  Renal/GU negative Renal ROS     Musculoskeletal negative musculoskeletal ROS (+)   Abdominal (+) + obese,   Peds  Hematology negative hematology ROS (+)   Anesthesia Other Findings Past Medical History: 11/13/11: Abnormal pap     Comment:  LSIL  HPV/MILD Dysplasia/CIN 1 No date: ADHD (attention deficit hyperactivity disorder) No date: Asthma No date: Dysmenorrhea No date: Migraines No date: Pneumonia   Reproductive/Obstetrics                             Anesthesia Physical Anesthesia Plan  ASA: II  Anesthesia Plan: General   Post-op Pain Management:    Induction: Intravenous  PONV Risk Score and Plan: 1 and Ondansetron  Airway Management Planned: LMA  Additional Equipment:   Intra-op Plan:   Post-operative Plan:   Informed Consent: I have reviewed the patients History and Physical, chart, labs and discussed the procedure including the risks, benefits and alternatives for the proposed anesthesia with the patient or authorized representative who has indicated  his/her understanding and acceptance.   Dental advisory given  Plan Discussed with: CRNA and Anesthesiologist  Anesthesia Plan Comments:         Anesthesia Quick Evaluation

## 2017-10-26 NOTE — Progress Notes (Signed)
Pt was instructed on how to redress her incision and she stated that she understood. I took vitals and she was WDL. She was released to her best friend. IV removed by Josh and there were no complications.

## 2017-10-26 NOTE — Anesthesia Procedure Notes (Signed)
Anesthesia Regional Block: Infraclavicular brachial plexus block  Narrative:

## 2017-10-26 NOTE — Discharge Summary (Signed)
  Patient ID: Sigurd SosJazmine Childrey MRN: [id number] DOB/AGE: December 03, 1990 26 y.o.  Admit date: 10/25/2017 Discharge date: 10/26/2017   Discharge Diagnoses:  Active Problems:   Breast abscess   Procedures: ID breast abscess  Hospital Course: Pt was admitted for breast abscess and underwent I/D in the OR. At the time of DC she was ambulating , taking po, AVSS. Condition at the time of DC is stable.    Disposition: 01-Home or Self Care  Discharge Instructions    Call MD for:  difficulty breathing, headache or visual disturbances   Complete by:  As directed    Call MD for:  extreme fatigue   Complete by:  As directed    Call MD for:  hives   Complete by:  As directed    Call MD for:  persistant dizziness or light-headedness   Complete by:  As directed    Call MD for:  persistant nausea and vomiting   Complete by:  As directed    Call MD for:  redness, tenderness, or signs of infection (pain, swelling, redness, odor or green/yellow discharge around incision site)   Complete by:  As directed    Call MD for:  severe uncontrolled pain   Complete by:  As directed    Call MD for:  temperature >100.4   Complete by:  As directed    Diet - low sodium heart healthy   Complete by:  As directed    Discharge instructions   Complete by:  As directed    Daily packing to right breast./ Please teach pt about packing wound   Increase activity slowly   Complete by:  As directed    Remove dressing in 24 hours   Complete by:  As directed      Allergies as of 10/26/2017   No Known Allergies     Medication List    STOP taking these medications   albuterol 108 (90 Base) MCG/ACT inhaler Commonly known as:  PROVENTIL HFA;VENTOLIN HFA   ciprofloxacin 500 MG tablet Commonly known as:  CIPRO   ondansetron 4 MG disintegrating tablet Commonly known as:  ZOFRAN ODT   Prenatal Vitamins 0.8 MG tablet   ranitidine 150 MG tablet Commonly known as:  ZANTAC     TAKE these medications    amoxicillin-clavulanate 875-125 MG tablet Commonly known as:  AUGMENTIN Take 1 tablet by mouth 2 (two) times daily for 10 days.   HYDROcodone-acetaminophen 5-325 MG tablet Commonly known as:  NORCO/VICODIN Take 1-2 tablets by mouth every 6 (six) hours as needed for moderate pain.      Follow-up Information    Elberta FortisBoyle, Daniel, MD. Schedule an appointment as soon as possible for a visit on 10/27/2017.   Specialty:  Diagnostic Radiology Why:  For ER visit follow-up and reevaluation. Contact information: [address] [place] [address] [phone]        [facility] Follow up in 1 week(s).            Sterling Bigiego Pabon, MD FACS

## 2017-10-26 NOTE — Op Note (Addendum)
  10/25/2017 - 10/26/2017  5:13 AM  PATIENT:  Bethany Hampton  26 y.o. female  PRE-OPERATIVE DIAGNOSIS:  Breast abscess  POST-OPERATIVE DIAGNOSIS:  Same  PROCEDURE:   1. Incision and drainage of complex Right Breast abscess 2. Excisional  debridement of skin subcutaneous tissue                           measuring 12 square centimeters    SURGEON:  Surgeon(s) and Role:    * Mieko Kneebone F, MD - Primary   ANESTHESIA: GETA  INDICATIONS FOR PROCEDURE Breast abscess  DICTATION:  Patient was explained about  the procedure  In detail, risk benefits possible complications and a consent was obtained. The patient taken to the operating room and placed in the supine position.  Elliptical Incision was created over the point of maximum fluctuance over the right breast within the niiple complex and pus was drained and cultured. There was complex luculations that we were able to lyse with a combination of finger fracture and suction device. All the loculations were broken down. Using a sharp curette we debrided the sub q tissue . Hemostasis was obtained with electrocautery. Irrigation with normal saline and the wound was packed with half-inch packing.  Needle and laparotomy counts were correct and there were no immediate complications  Leafy Roiego F Cheyna Retana, MD

## 2017-10-26 NOTE — Anesthesia Postprocedure Evaluation (Signed)
Anesthesia Post Note  Patient: Bethany Hampton  Procedure(s) Performed: INCISION AND DRAINAGE ABSCESS (Right Breast)  Patient location during evaluation: PACU Anesthesia Type: General Level of consciousness: awake and alert and oriented Pain management: pain level controlled Vital Signs Assessment: post-procedure vital signs reviewed and stable Respiratory status: spontaneous breathing, nonlabored ventilation and respiratory function stable Cardiovascular status: blood pressure returned to baseline and stable Postop Assessment: no signs of nausea or vomiting Anesthetic complications: no     Last Vitals:  Vitals:   10/26/17 0603 10/26/17 0629  BP: 118/74 123/70  Pulse: 74 71  Resp: 12   Temp: (!) 36.3 C (!) 36.3 C  SpO2: 100% 100%    Last Pain:  Vitals:   10/26/17 0629  TempSrc: Oral  PainSc:                  Habib Kise

## 2017-10-26 NOTE — Anesthesia Post-op Follow-up Note (Signed)
Anesthesia QCDR form completed.        

## 2017-10-26 NOTE — Anesthesia Procedure Notes (Signed)
Procedure Name: LMA Insertion Date/Time: 10/26/2017 4:57 AM Performed by: Waldo LaineJustis, Bertice Risse, CRNA Pre-anesthesia Checklist: Patient identified, Patient being monitored, Timeout performed, Emergency Drugs available and Suction available Patient Re-evaluated:Patient Re-evaluated prior to induction Oxygen Delivery Method: Circle system utilized Preoxygenation: Pre-oxygenation with 100% oxygen Induction Type: IV induction Ventilation: Mask ventilation without difficulty LMA: LMA inserted Tube type: Oral Number of attempts: 1 Placement Confirmation: positive ETCO2 and breath sounds checked- equal and bilateral Tube secured with: Tape Dental Injury: Teeth and Oropharynx as per pre-operative assessment

## 2017-10-26 NOTE — Progress Notes (Signed)
Pharmacy Antibiotic Note  Sigurd SosJazmine General is a 26 y.o. female admitted on 10/25/2017 with abscess.  Pharmacy has been consulted for vancomycin dosing.  Plan: DW 75kg  Vd 53L kei 0.096 hr-1  T1/2 7 hours Vancomycin 1 gram q 8 hours ordered with stacked dosing. Level before 5th dose. Goal trough 15-20   Height: 5\' 8"  (172.7 cm) Weight: 200 lb (90.7 kg) IBW/kg (Calculated) : 63.9  Temp (24hrs), Avg:98 F (36.7 C), Min:97.6 F (36.4 C), Max:98.4 F (36.9 C)  Recent Labs  Lab 10/25/17 1839 10/26/17 0108  WBC 13.1*  --   CREATININE 0.84 0.70    Estimated Creatinine Clearance: 124.4 mL/min (by C-G formula based on SCr of 0.7 mg/dL).    No Known Allergies  Antimicrobials this admission: Clindamycin x1; Vancomycin, Unasyn 12/24  >>    >>   Dose adjustments this admission:   Microbiology results: 12/24 BCx: pending 12/24 MRSA PCR: (-)   Thank you for allowing pharmacy to be a part of this patient's care.  Ledonna Dormer S 10/26/2017 2:57 AM

## 2017-10-27 ENCOUNTER — Encounter: Payer: Self-pay | Admitting: Surgery

## 2017-10-27 LAB — HIV ANTIBODY (ROUTINE TESTING W REFLEX): HIV SCREEN 4TH GENERATION: NONREACTIVE

## 2017-10-30 LAB — CULTURE, BLOOD (ROUTINE X 2)
CULTURE: NO GROWTH
Culture: NO GROWTH
SPECIAL REQUESTS: ADEQUATE

## 2017-10-31 LAB — AEROBIC/ANAEROBIC CULTURE (SURGICAL/DEEP WOUND)

## 2017-10-31 LAB — AEROBIC/ANAEROBIC CULTURE W GRAM STAIN (SURGICAL/DEEP WOUND)

## 2017-11-01 ENCOUNTER — Ambulatory Visit: Payer: Medicaid Other

## 2017-11-01 ENCOUNTER — Other Ambulatory Visit: Payer: Self-pay

## 2017-11-01 VITALS — BP 135/85 | HR 88 | Temp 98.2°F | Ht 68.0 in

## 2017-11-01 DIAGNOSIS — N611 Abscess of the breast and nipple: Secondary | ICD-10-CM

## 2017-11-01 NOTE — Progress Notes (Signed)
Removed packing from wound.  Replaced packing and applied large band aid.  Minimal drainage, no odor, no pain. Patient currently taking antibiotic, 6 pills left. Post op appointment made 11/08/17 with Dr.Piscoya. Patient instructed to change packing daily.

## 2017-11-01 NOTE — Patient Instructions (Signed)
Please continue to wash the area with soap and water daily and apply dressing.  Please see your follow up appointment listed below.

## 2017-11-08 ENCOUNTER — Encounter: Payer: Medicaid Other | Admitting: Surgery

## 2017-11-09 ENCOUNTER — Encounter: Payer: Self-pay | Admitting: Surgery

## 2017-11-09 ENCOUNTER — Telehealth: Payer: Self-pay | Admitting: Surgery

## 2017-11-09 NOTE — Telephone Encounter (Signed)
Unable to leave a message for the patient, due to the number being disconnected. I have mailed a letter to the patient to have her give our office a call to r/s her missed appointment on 11/08/17. Please r/s missed appointment if patient calls back.

## 2017-11-11 ENCOUNTER — Encounter: Payer: Self-pay | Admitting: Surgery

## 2017-11-11 NOTE — Telephone Encounter (Signed)
Unable to leave message for the patient. I have mailed a letter to the patient to have the patient give us a call.

## 2023-04-29 ENCOUNTER — Ambulatory Visit (INDEPENDENT_AMBULATORY_CARE_PROVIDER_SITE_OTHER): Payer: BC Managed Care – PPO

## 2023-04-29 ENCOUNTER — Ambulatory Visit
Admission: EM | Admit: 2023-04-29 | Discharge: 2023-04-29 | Disposition: A | Discharge status: Home / Self Care | Payer: BC Managed Care – PPO | Attending: Physician Assistant | Admitting: Physician Assistant

## 2023-04-29 DIAGNOSIS — N644 Mastodynia: Secondary | ICD-10-CM

## 2023-04-29 DIAGNOSIS — J45909 Unspecified asthma, uncomplicated: Secondary | ICD-10-CM

## 2023-04-29 DIAGNOSIS — R0789 Other chest pain: Secondary | ICD-10-CM

## 2023-04-29 DIAGNOSIS — N6019 Diffuse cystic mastopathy of unspecified breast: Secondary | ICD-10-CM | POA: Diagnosis not present

## 2023-04-29 MED ORDER — ALBUTEROL SULFATE HFA 108 (90 BASE) MCG/ACT IN AERS: 1.0000 | INHALATION_SPRAY | Freq: Four times a day (QID) | RESPIRATORY_TRACT | 0 refills | Status: AC | PRN

## 2023-04-29 NOTE — ED Provider Notes (Signed)
[facility]    CSN: [id number] Arrival date & time: 04/29/23  1820      History   Chief Complaint Chief Complaint  Patient presents with   Breast Problem    HPI Bethany Hampton is a 32 y.o. female presenting for concerns about diffuse chest pain, tenderness of bilateral breasts and intermittent sharp pains that seem to radiate through the left nipple since yesterday.  She reports her chest feels tight.  Denies feeling short of breath but states it feels little difficult to take a deep breath.  She reports that she has a history of breast abscess of the right breast that happened after she had her baby.  She says it led to a severe infection and she had to have surgery.  She is always fearful of something like that.  She also has a history of asthma.  She denies fever, fatigue, cough, congestion, abdominal pain.  Last menstrual period was approximately 2 weeks ago.  She reports that she does have history of fibrocystic breast and has had discomfort here and there.  She reports she thinks lumbar pain could be due to her sports bra being tight.  She says her breasts are not really hurting at this time but she has pain along the bra line bilaterally.  Has not taken any medicine for discomfort.  HPI  Past Medical History:  Diagnosis Date   Abnormal pap 11/13/11   LSIL  HPV/MILD Dysplasia/CIN 1   ADHD (attention deficit hyperactivity disorder)    Asthma    Dysmenorrhea    Migraines    Pneumonia     Patient Active Problem List   Diagnosis Date Noted   Breast abscess 10/25/2017   Postpartum care following vaginal delivery 09/15/2016   GBS (group B Streptococcus carrier), +RV culture, currently pregnant 08/23/2016   Family history of spina bifida 03/17/2016   Obesity (BMI 30-39.9) 02/25/2016   Cholecystitis 01/03/2015    Past Surgical History:  Procedure Laterality Date   BREAST SURGERY  2005   Rt breast cyst(benign)   INCISION AND DRAINAGE ABSCESS Right 10/26/2017    Procedure: INCISION AND DRAINAGE ABSCESS;  Surgeon: Leafy Ro, MD;  Location: [facility] ORS;  Service: General;  Laterality: Right;    OB History     Gravida  2   Para  0   Term      Preterm      AB  1   Living  0      SAB      IAB      Ectopic      Multiple      Live Births               Home Medications    Prior to Admission medications   Medication Sig Start Date End Date Taking? Authorizing Provider  albuterol (VENTOLIN HFA) 108 (90 Base) MCG/ACT inhaler Inhale 1-2 puffs into the lungs every 6 (six) hours as needed for wheezing or shortness of breath. 04/29/23  Yes Shirlee Latch, PA-C  etonogestrel-ethinyl estradiol (NUVARING) 0.12-0.015 MG/24HR vaginal ring Place vaginally. 11/03/16 11/03/17  [provider]    Family History Family History  Problem Relation Age of Onset   Heart failure Mother    Hypertension Mother    Hypertension Sister    Cancer Paternal Grandmother     Social History Social History   Tobacco Use   Smoking status: Some Days    Types: Cigars   Smokeless tobacco: Never  Substance Use Topics   Alcohol use: No    Comment: l   Drug use: No     Allergies   Patient has no known allergies.   Review of Systems Review of Systems  Constitutional:  Negative for fatigue and fever.  HENT:  Negative for congestion.   Respiratory:  Positive for chest tightness. Negative for cough and shortness of breath.   Cardiovascular:  Positive for chest pain (and breast/nipple pain (left)).  Gastrointestinal:  Negative for abdominal pain, nausea and vomiting.  Genitourinary:  Negative for flank pain.  Neurological:  Negative for dizziness, weakness and headaches.  Psychiatric/Behavioral:  The patient is nervous/anxious.      Physical Exam Triage Vital Signs ED Triage Vitals  Enc Vitals Group     BP 04/29/23 1829 123/85     Pulse Rate 04/29/23 1829 82     Resp 04/29/23 1829 16     Temp 04/29/23 1829 98.4 F (36.9 C)      Temp Source 04/29/23 1829 Oral     SpO2 04/29/23 1829 97 %     Weight 04/29/23 1828 213 lb (96.6 kg)     Height 04/29/23 1828 5\' 9"  (1.753 m)     Head Circumference --      Peak Flow --      Pain Score 04/29/23 1832 7     Pain Loc --      Pain Edu? --      Excl. in GC? --    No data found.  Updated Vital Signs BP 123/85 (BP Location: Left Arm)   Pulse 82   Temp 98.4 F (36.9 C) (Oral)   Resp 16   Ht 5\' 9"  (1.753 m)   Wt 213 lb (96.6 kg)   SpO2 97%   BMI 31.45 kg/m      Physical Exam Vitals and nursing note reviewed.  Constitutional:      General: She is not in acute distress.    Appearance: Normal appearance. She is not ill-appearing or toxic-appearing.  HENT:     Head: Normocephalic and atraumatic.  Eyes:     General: No scleral icterus.       Right eye: No discharge.        Left eye: No discharge.     Conjunctiva/sclera: Conjunctivae normal.  Cardiovascular:     Rate and Rhythm: Normal rate and regular rhythm.     Heart sounds: Normal heart sounds.  Pulmonary:     Effort: Pulmonary effort is normal. No respiratory distress.     Breath sounds: Normal breath sounds.  Chest:     Chest wall: Tenderness (TTP bilateral mid axillary line) present. No mass.  Breasts:    Right: No swelling, nipple discharge, skin change or tenderness.     Left: No swelling, nipple discharge, skin change or tenderness.     Comments: Fibrocystic tissue bilaterally.  No erythema/warmth or breast swelling Musculoskeletal:     Cervical back: Neck supple.  Skin:    General: Skin is dry.  Neurological:     General: No focal deficit present.     Mental Status: She is alert. Mental status is at baseline.     Motor: No weakness.     Gait: Gait normal.  Psychiatric:        Mood and Affect: Mood normal.        Behavior: Behavior normal.        Thought Content: Thought content normal.      UC Treatments /  Results  Labs (all labs ordered are listed, but only abnormal results are  displayed) Labs Reviewed - No data to display  EKG   Radiology DG Chest 2 View  Result Date: 04/29/2023 CLINICAL DATA:  Chest pain EXAM: CHEST - 2 VIEW COMPARISON:  None Available. FINDINGS: The heart size and mediastinal contours are within normal limits. Both lungs are clear. The visualized skeletal structures are unremarkable. IMPRESSION: No active cardiopulmonary disease. Electronically Signed   By: Helyn Numbers M.D.   On: 04/29/2023 19:21    Procedures ED EKG  Date/Time: 04/29/2023 7:07 PM  Performed by: Shirlee Latch, PA-C Authorized by: Shirlee Latch, PA-C   Previous ECG:    Previous ECG:  Unavailable Interpretation:    Interpretation: normal   Rate:    ECG rate:  76   ECG rate assessment: normal   Rhythm:    Rhythm: sinus rhythm   Ectopy:    Ectopy: none   QRS:    QRS axis:  Normal   QRS intervals:  Normal   QRS conduction: normal   ST segments:    ST segments:  Normal T waves:    T waves: normal   Comments:     Normal sinus rhythm. Regular rate.  (including critical care time)  Medications Ordered in UC Medications - No data to display  Initial Impression / Assessment and Plan / UC Course  I have reviewed the triage vital signs and the nursing notes.  Pertinent labs & imaging results that were available during my care of the patient were reviewed by me and considered in my medical decision making (see chart for details).   32 year old female presents for bilateral breast tenderness which is worse on the left side with diffuse chest pain/tightness and occasional sharp pains in the right nipple since yesterday.  Vitals are normal and stable.  Patient overall well-appearing.  Mildly anxious.  On exam she has fibrocystic tissue throughout bilateral breast.  There is no notable breast tenderness.  She does have tenderness of bilateral mid axillary line.  No swelling, erythema or increased warmth of breast.  No crusting or discharge from nipples.  Chest  clear to auscultation heart regular rate and rhythm.  EKG performed today shows normal sinus rhythm and regular rate.  Chest x-ray ordered.  Normal result.  Reviewed all results with patient.  Advised patient symptoms could be due to musculoskeletal type chest pain, fibrocystic breast discomfort or potential asthma exacerbation although I do not hear any wheezing.  She says she does not have an inhaler at home to try but would like 1.  I sent albuterol inhaler to the pharmacy.  Advised Tylenol, Motrin, warm compresses.  Advised to make a follow-up appoint with PCP especially if symptoms continue.   Final Clinical Impressions(s) / UC Diagnoses   Final diagnoses:  Chest wall pain  Breast pain  Fibrocystic breast changes, unspecified laterality  Asthma, unspecified asthma severity, unspecified whether complicated, unspecified whether persistent     Discharge Instructions      -Your vital signs are all normal. - Your physical exam is reassuring.  You have fibrocystic tissue of both breasts.  This could be causing her discomfort somewhat.  Continue to wear the sports bra.  Take ibuprofen or Tylenol for discomfort.  May also apply warm compresses. - Your chest x-ray and EKG were both normal. - Extremely low concern for a blood clot or heart problem given your vital signs, your physical exam and your workup here  today. - However, if you feel that your pain is getting worse or you are starting to feel really short of breath and your inhaler is not helping, you begin to feel weak, dizzy, please go to the ER for further evaluation. - If you still have concerns about your nipple or breast discomfort please follow-up with your PCP as they may want to order an ultrasound of your breasts if you have a family history of breast cancer.     ED Prescriptions     Medication Sig Dispense Auth. Provider   albuterol (VENTOLIN HFA) 108 (90 Base) MCG/ACT inhaler Inhale 1-2 puffs into the lungs every 6  (six) hours as needed for wheezing or shortness of breath. 1 g Shirlee Latch, PA-C      PDMP not reviewed this encounter.   Shirlee Latch, PA-C 04/29/23 1930

## 2023-04-29 NOTE — Discharge Instructions (Addendum)
-  Your vital signs are all normal. - Your physical exam is reassuring.  You have fibrocystic tissue of both breasts.  This could be causing her discomfort somewhat.  Continue to wear the sports bra.  Take ibuprofen or Tylenol for discomfort.  May also apply warm compresses. - Your chest x-ray and EKG were both normal. - Extremely low concern for a blood clot or heart problem given your vital signs, your physical exam and your workup here today. - However, if you feel that your pain is getting worse or you are starting to feel really short of breath and your inhaler is not helping, you begin to feel weak, dizzy, please go to the ER for further evaluation. - If you still have concerns about your nipple or breast discomfort please follow-up with your PCP as they may want to order an ultrasound of your breasts if you have a family history of breast cancer.

## 2023-04-29 NOTE — ED Triage Notes (Addendum)
Pt c/o nipple pain in L breast x1 day. Denies any drainage. Hx of I&D,breast cysts in R breast. Pain sharp & burning at times. Fam hx of breast cancer.
